# Patient Record
Sex: Female | Born: 1945 | Race: White | Hispanic: No | Marital: Married | Smoking: Never smoker
Health system: Southern US, Community
[De-identification: ages and names within clinical notes are randomized; demographics above are authoritative.]

## PROBLEM LIST (undated history)

## (undated) DIAGNOSIS — I809 Phlebitis and thrombophlebitis of unspecified site: Secondary | ICD-10-CM

## (undated) DIAGNOSIS — Z9884 Bariatric surgery status: Secondary | ICD-10-CM

## (undated) DIAGNOSIS — M542 Cervicalgia: Secondary | ICD-10-CM

## (undated) DIAGNOSIS — Z86718 Personal history of other venous thrombosis and embolism: Secondary | ICD-10-CM

## (undated) DIAGNOSIS — I10 Essential (primary) hypertension: Secondary | ICD-10-CM

## (undated) DIAGNOSIS — B20 Human immunodeficiency virus [HIV] disease: Secondary | ICD-10-CM

## (undated) HISTORY — DX: Cervicalgia: M54.2

## (undated) HISTORY — DX: Human immunodeficiency virus (HIV) disease: B20

## (undated) HISTORY — DX: Personal history of other venous thrombosis and embolism: Z86.718

## (undated) HISTORY — DX: Bariatric surgery status: Z98.84

## (undated) HISTORY — DX: Phlebitis and thrombophlebitis of unspecified site: I80.9

## (undated) HISTORY — DX: Essential (primary) hypertension: I10

---
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
## 1975-02-18 HISTORY — PX: TUBAL LIGATION: SHX77

## 1994-02-17 HISTORY — PX: CHOLECYSTECTOMY: SHX55

## 2000-06-17 HISTORY — PX: GASTRIC BYPASS: SHX52

## 2001-02-17 DIAGNOSIS — B20 Human immunodeficiency virus [HIV] disease: Secondary | ICD-10-CM

## 2001-02-17 DIAGNOSIS — Z21 Asymptomatic human immunodeficiency virus [HIV] infection status: Secondary | ICD-10-CM

## 2001-02-17 HISTORY — DX: Human immunodeficiency virus (HIV) disease: B20

## 2001-02-17 HISTORY — DX: Asymptomatic human immunodeficiency virus (hiv) infection status: Z21

## 2002-02-17 HISTORY — PX: HERNIA REPAIR: SHX51

## 2005-01-30 ENCOUNTER — Encounter (INDEPENDENT_AMBULATORY_CARE_PROVIDER_SITE_OTHER): Payer: Self-pay | Admitting: *Deleted

## 2005-01-30 ENCOUNTER — Ambulatory Visit (HOSPITAL_COMMUNITY): Admission: RE | Admit: 2005-01-30 | Discharge: 2005-01-30 | Payer: Self-pay | Admitting: Infectious Diseases

## 2005-01-30 ENCOUNTER — Ambulatory Visit: Payer: Self-pay | Admitting: Infectious Diseases

## 2005-01-30 LAB — CONVERTED CEMR LAB: CD4 T Cell Abs: 610

## 2005-02-14 ENCOUNTER — Ambulatory Visit: Payer: Self-pay | Admitting: Infectious Diseases

## 2005-03-10 ENCOUNTER — Ambulatory Visit (HOSPITAL_COMMUNITY): Admission: RE | Admit: 2005-03-10 | Discharge: 2005-03-10 | Payer: Self-pay | Admitting: Infectious Diseases

## 2005-05-19 ENCOUNTER — Encounter: Admission: RE | Admit: 2005-05-19 | Discharge: 2005-05-19 | Payer: Self-pay | Admitting: Infectious Diseases

## 2005-05-19 ENCOUNTER — Ambulatory Visit: Payer: Self-pay | Admitting: Infectious Diseases

## 2005-05-19 ENCOUNTER — Encounter (INDEPENDENT_AMBULATORY_CARE_PROVIDER_SITE_OTHER): Payer: Self-pay | Admitting: *Deleted

## 2005-10-07 ENCOUNTER — Encounter (INDEPENDENT_AMBULATORY_CARE_PROVIDER_SITE_OTHER): Payer: Self-pay | Admitting: *Deleted

## 2005-10-07 ENCOUNTER — Ambulatory Visit: Payer: Self-pay | Admitting: Infectious Diseases

## 2005-10-07 ENCOUNTER — Encounter: Admission: RE | Admit: 2005-10-07 | Discharge: 2005-10-07 | Payer: Self-pay | Admitting: Infectious Diseases

## 2005-12-29 ENCOUNTER — Other Ambulatory Visit: Admission: RE | Admit: 2005-12-29 | Discharge: 2005-12-29 | Payer: Self-pay | Admitting: Family Medicine

## 2005-12-29 DIAGNOSIS — N879 Dysplasia of cervix uteri, unspecified: Secondary | ICD-10-CM | POA: Insufficient documentation

## 2006-01-05 ENCOUNTER — Ambulatory Visit: Payer: Self-pay | Admitting: Infectious Diseases

## 2006-03-23 ENCOUNTER — Ambulatory Visit: Payer: Self-pay | Admitting: Infectious Diseases

## 2006-03-23 ENCOUNTER — Encounter (INDEPENDENT_AMBULATORY_CARE_PROVIDER_SITE_OTHER): Payer: Self-pay | Admitting: *Deleted

## 2006-03-23 ENCOUNTER — Encounter: Admission: RE | Admit: 2006-03-23 | Discharge: 2006-03-23 | Payer: Self-pay | Admitting: Infectious Diseases

## 2006-03-23 DIAGNOSIS — I10 Essential (primary) hypertension: Secondary | ICD-10-CM | POA: Insufficient documentation

## 2006-03-23 DIAGNOSIS — B20 Human immunodeficiency virus [HIV] disease: Secondary | ICD-10-CM

## 2006-03-23 DIAGNOSIS — Z9119 Patient's noncompliance with other medical treatment and regimen: Secondary | ICD-10-CM

## 2006-03-23 DIAGNOSIS — Z9889 Other specified postprocedural states: Secondary | ICD-10-CM

## 2006-03-23 DIAGNOSIS — Z91199 Patient's noncompliance with other medical treatment and regimen due to unspecified reason: Secondary | ICD-10-CM | POA: Insufficient documentation

## 2006-03-23 LAB — CONVERTED CEMR LAB
ALT: 16 units/L (ref 0–35)
Albumin: 4 g/dL (ref 3.5–5.2)
BUN: 17 mg/dL (ref 6–23)
CD4 Count: 290 microliters
CO2: 26 meq/L (ref 19–32)
Cholesterol: 152 mg/dL (ref 0–200)
Creatinine, Ser: 0.69 mg/dL (ref 0.40–1.20)
HCT: 42.4 % (ref 36.0–46.0)
HIV 1 RNA Quant: 123000 copies/mL — ABNORMAL HIGH (ref <50)
HIV-1 RNA Quant, Log: 5.09 — ABNORMAL HIGH (ref <1.70)
Hemoglobin: 14 g/dL (ref 12.0–15.0)
LDL Cholesterol: 82 mg/dL (ref 0–99)
MCHC: 33 g/dL (ref 30.0–36.0)
Potassium: 4.2 meq/L (ref 3.5–5.3)
RDW: 13.5 % (ref 11.5–14.0)
Total Bilirubin: 0.3 mg/dL (ref 0.3–1.2)
Total CHOL/HDL Ratio: 4.5
Total Protein: 7.5 g/dL (ref 6.0–8.3)
VLDL: 36 mg/dL (ref 0–40)

## 2006-04-13 ENCOUNTER — Encounter (INDEPENDENT_AMBULATORY_CARE_PROVIDER_SITE_OTHER): Payer: Self-pay | Admitting: *Deleted

## 2006-04-13 LAB — CONVERTED CEMR LAB: Pap Smear: ABNORMAL

## 2006-04-26 ENCOUNTER — Encounter (INDEPENDENT_AMBULATORY_CARE_PROVIDER_SITE_OTHER): Payer: Self-pay | Admitting: *Deleted

## 2006-06-01 ENCOUNTER — Encounter: Admission: RE | Admit: 2006-06-01 | Discharge: 2006-06-01 | Payer: Self-pay | Admitting: Infectious Diseases

## 2006-06-01 ENCOUNTER — Ambulatory Visit: Payer: Self-pay | Admitting: Infectious Diseases

## 2006-06-01 ENCOUNTER — Encounter (INDEPENDENT_AMBULATORY_CARE_PROVIDER_SITE_OTHER): Payer: Self-pay | Admitting: *Deleted

## 2006-06-01 DIAGNOSIS — R1031 Right lower quadrant pain: Secondary | ICD-10-CM

## 2006-06-01 LAB — CONVERTED CEMR LAB
Basophils Absolute: 0 10*3/uL (ref 0.0–0.1)
Basophils Relative: 0 % (ref 0–1)
CO2: 26 meq/L (ref 19–32)
Calcium: 8.8 mg/dL (ref 8.4–10.5)
Chloride: 108 meq/L (ref 96–112)
Cholesterol: 146 mg/dL (ref 0–200)
Creatinine, Ser: 0.6 mg/dL (ref 0.40–1.20)
Eosinophils Absolute: 0.1 10*3/uL (ref 0.0–0.7)
HDL: 34 mg/dL — ABNORMAL LOW (ref >39)
HIV 1 RNA Quant: 53900 copies/mL — ABNORMAL HIGH (ref <50)
Lymphs Abs: 1 10*3/uL (ref 0.7–3.3)
MCHC: 32 g/dL (ref 30.0–36.0)
MCV: 87.7 fL (ref 78.0–100.0)
Monocytes Relative: 9 % (ref 3–11)
Neutro Abs: 1.8 10*3/uL (ref 1.7–7.7)
Platelets: 150 10*3/uL (ref 150–400)
Potassium: 4.2 meq/L (ref 3.5–5.3)
RDW: 13.4 % (ref 11.5–14.0)
Sodium: 141 meq/L (ref 135–145)
Total Bilirubin: 0.3 mg/dL (ref 0.3–1.2)
Total CHOL/HDL Ratio: 4.3
VLDL: 29 mg/dL (ref 0–40)
WBC: 3.1 10*3/uL — ABNORMAL LOW (ref 4.0–10.5)

## 2006-06-04 ENCOUNTER — Telehealth: Payer: Self-pay | Admitting: Infectious Diseases

## 2006-06-24 ENCOUNTER — Encounter: Admission: RE | Admit: 2006-06-24 | Discharge: 2006-06-24 | Payer: Self-pay | Admitting: *Deleted

## 2006-06-30 ENCOUNTER — Telehealth: Payer: Self-pay | Admitting: Infectious Diseases

## 2006-09-09 ENCOUNTER — Telehealth: Payer: Self-pay | Admitting: Infectious Diseases

## 2006-12-21 ENCOUNTER — Encounter: Admission: RE | Admit: 2006-12-21 | Discharge: 2006-12-21 | Payer: Self-pay | Admitting: Infectious Diseases

## 2006-12-21 ENCOUNTER — Ambulatory Visit: Payer: Self-pay | Admitting: Infectious Diseases

## 2006-12-21 LAB — CONVERTED CEMR LAB
BUN: 13 mg/dL (ref 6–23)
Chloride: 106 meq/L (ref 96–112)
Creatinine, Ser: 0.61 mg/dL (ref 0.40–1.20)
Glucose, Bld: 87 mg/dL (ref 70–99)
HIV 1 RNA Quant: 166000 copies/mL — ABNORMAL HIGH (ref <50)
HIV-1 RNA Quant, Log: 5.22 — ABNORMAL HIGH (ref <1.70)
Hemoglobin: 13.6 g/dL (ref 12.0–15.0)
MCHC: 32.2 g/dL (ref 30.0–36.0)
Platelets: 132 10*3/uL — ABNORMAL LOW (ref 150–400)
Potassium: 3.9 meq/L (ref 3.5–5.3)
RDW: 13.5 % (ref 11.5–14.0)
Sodium: 141 meq/L (ref 135–145)
Total Bilirubin: 0.3 mg/dL (ref 0.3–1.2)
Total Protein: 7.4 g/dL (ref 6.0–8.3)
WBC: 2.8 10*3/uL — ABNORMAL LOW (ref 4.0–10.5)

## 2006-12-28 ENCOUNTER — Telehealth: Payer: Self-pay | Admitting: Infectious Diseases

## 2007-01-04 ENCOUNTER — Ambulatory Visit (HOSPITAL_COMMUNITY): Admission: RE | Admit: 2007-01-04 | Discharge: 2007-01-04 | Payer: Self-pay | Admitting: Infectious Diseases

## 2007-02-16 ENCOUNTER — Encounter (INDEPENDENT_AMBULATORY_CARE_PROVIDER_SITE_OTHER): Payer: Self-pay | Admitting: *Deleted

## 2007-02-16 ENCOUNTER — Encounter: Payer: Self-pay | Admitting: Infectious Diseases

## 2007-03-12 ENCOUNTER — Encounter: Admission: RE | Admit: 2007-03-12 | Discharge: 2007-03-12 | Payer: Self-pay | Admitting: Infectious Diseases

## 2007-03-12 ENCOUNTER — Ambulatory Visit: Payer: Self-pay | Admitting: Infectious Diseases

## 2007-03-12 LAB — CONVERTED CEMR LAB
Albumin: 3.8 g/dL (ref 3.5–5.2)
Alkaline Phosphatase: 120 units/L — ABNORMAL HIGH (ref 39–117)
CO2: 23 meq/L (ref 19–32)
Creatinine, Ser: 0.62 mg/dL (ref 0.40–1.20)
Eosinophils Relative: 2 % (ref 0–5)
Glucose, Bld: 82 mg/dL (ref 70–99)
HCT: 41.9 % (ref 36.0–46.0)
HDL: 34 mg/dL — ABNORMAL LOW (ref >39)
HIV 1 RNA Quant: 144000 copies/mL — ABNORMAL HIGH (ref <50)
Hemoglobin: 13.7 g/dL (ref 12.0–15.0)
LDL Cholesterol: 72 mg/dL (ref 0–99)
Lymphocytes Relative: 41 % (ref 12–46)
Lymphs Abs: 1 10*3/uL (ref 0.7–4.0)
Monocytes Relative: 10 % (ref 3–12)
Neutrophils Relative %: 47 % (ref 43–77)
Platelets: 114 10*3/uL — ABNORMAL LOW (ref 150–400)
Sodium: 142 meq/L (ref 135–145)
Total Bilirubin: 0.3 mg/dL (ref 0.3–1.2)
Triglycerides: 130 mg/dL (ref <150)
VLDL: 26 mg/dL (ref 0–40)

## 2007-03-24 ENCOUNTER — Ambulatory Visit: Payer: Self-pay | Admitting: Infectious Diseases

## 2007-03-25 ENCOUNTER — Telehealth: Payer: Self-pay | Admitting: Infectious Diseases

## 2007-04-15 ENCOUNTER — Ambulatory Visit (HOSPITAL_COMMUNITY): Admission: RE | Admit: 2007-04-15 | Discharge: 2007-04-15 | Payer: Self-pay | Admitting: Infectious Diseases

## 2007-05-14 ENCOUNTER — Emergency Department (HOSPITAL_COMMUNITY): Admission: EM | Admit: 2007-05-14 | Discharge: 2007-05-14 | Payer: Self-pay | Admitting: Emergency Medicine

## 2007-06-10 ENCOUNTER — Encounter: Admission: RE | Admit: 2007-06-10 | Discharge: 2007-06-10 | Payer: Self-pay | Admitting: Infectious Diseases

## 2007-06-10 ENCOUNTER — Ambulatory Visit: Payer: Self-pay | Admitting: Infectious Diseases

## 2007-06-10 LAB — CONVERTED CEMR LAB
AST: 17 units/L (ref 0–37)
Albumin: 4.1 g/dL (ref 3.5–5.2)
Alkaline Phosphatase: 109 units/L (ref 39–117)
BUN: 19 mg/dL (ref 6–23)
Calcium: 8.8 mg/dL (ref 8.4–10.5)
Creatinine, Ser: 0.73 mg/dL (ref 0.40–1.20)
Eosinophils Relative: 3 % (ref 0–5)
HIV-1 RNA Quant, Log: <1.7 (ref <1.70)
Lymphocytes Relative: 43 % (ref 12–46)
Lymphs Abs: 1.4 10*3/uL (ref 0.7–4.0)
Monocytes Absolute: 0.2 10*3/uL (ref 0.1–1.0)
Monocytes Relative: 7 % (ref 3–12)
Platelets: 152 10*3/uL (ref 150–400)
RBC: 4.46 M/uL (ref 3.87–5.11)
Total Bilirubin: 0.4 mg/dL (ref 0.3–1.2)

## 2007-06-24 ENCOUNTER — Ambulatory Visit: Payer: Self-pay | Admitting: Infectious Diseases

## 2007-06-24 ENCOUNTER — Encounter (INDEPENDENT_AMBULATORY_CARE_PROVIDER_SITE_OTHER): Payer: Self-pay | Admitting: *Deleted

## 2007-10-29 ENCOUNTER — Other Ambulatory Visit: Admission: RE | Admit: 2007-10-29 | Discharge: 2007-10-29 | Payer: Self-pay | Admitting: Obstetrics and Gynecology

## 2007-11-26 ENCOUNTER — Encounter: Admission: RE | Admit: 2007-11-26 | Discharge: 2007-11-26 | Payer: Self-pay | Admitting: Obstetrics and Gynecology

## 2007-12-23 ENCOUNTER — Ambulatory Visit: Payer: Self-pay | Admitting: Infectious Diseases

## 2007-12-23 LAB — CONVERTED CEMR LAB
ALT: 9 units/L (ref 0–35)
Albumin: 4 g/dL (ref 3.5–5.2)
CO2: 25 meq/L (ref 19–32)
Creatinine, Ser: 0.75 mg/dL (ref 0.40–1.20)
HCT: 40 % (ref 36.0–46.0)
Hemoglobin: 12.7 g/dL (ref 12.0–15.0)
MCV: 86.2 fL (ref 78.0–100.0)
Platelets: 177 10*3/uL (ref 150–400)
RDW: 13.8 % (ref 11.5–15.5)
Total Bilirubin: 0.3 mg/dL (ref 0.3–1.2)

## 2007-12-24 ENCOUNTER — Encounter: Payer: Self-pay | Admitting: Infectious Diseases

## 2008-01-06 ENCOUNTER — Ambulatory Visit: Payer: Self-pay | Admitting: Infectious Diseases

## 2008-01-06 LAB — HM PAP SMEAR

## 2008-06-29 ENCOUNTER — Ambulatory Visit: Payer: Self-pay | Admitting: Infectious Diseases

## 2008-06-29 LAB — CONVERTED CEMR LAB
ALT: 10 units/L (ref 0–35)
BUN: 17 mg/dL (ref 6–23)
Basophils Absolute: 0 10*3/uL (ref 0.0–0.1)
Basophils Relative: 0 % (ref 0–1)
Chloride: 109 meq/L (ref 96–112)
Creatinine, Ser: 0.64 mg/dL (ref 0.40–1.20)
Eosinophils Absolute: 0.1 10*3/uL (ref 0.0–0.7)
Eosinophils Relative: 5 % (ref 0–5)
GFR calc Af Amer: >60 mL/min (ref >60)
GFR calc non Af Amer: >60 mL/min (ref >60)
HDL: 28 mg/dL — ABNORMAL LOW (ref >39)
HIV-1 RNA Quant, Log: 5.39 — ABNORMAL HIGH (ref <1.68)
Hemoglobin: 12.3 g/dL (ref 12.0–15.0)
Monocytes Absolute: 0.2 10*3/uL (ref 0.1–1.0)
Neutro Abs: 1.4 10*3/uL — ABNORMAL LOW (ref 1.7–7.7)
Neutrophils Relative %: 54 % (ref 43–77)
Potassium: 3.8 meq/L (ref 3.5–5.3)
RBC: 4.57 M/uL (ref 3.87–5.11)
Total Bilirubin: 0.3 mg/dL (ref 0.3–1.2)
WBC: 2.7 10*3/uL — ABNORMAL LOW (ref 4.0–10.5)

## 2008-07-12 ENCOUNTER — Ambulatory Visit: Payer: Self-pay | Admitting: Infectious Diseases

## 2008-07-12 LAB — CONVERTED CEMR LAB: HIV-1 RNA Quant, Log: 5.72 — ABNORMAL HIGH (ref <1.68)

## 2008-08-01 ENCOUNTER — Encounter (INDEPENDENT_AMBULATORY_CARE_PROVIDER_SITE_OTHER): Payer: Self-pay | Admitting: *Deleted

## 2008-12-28 ENCOUNTER — Ambulatory Visit: Payer: Self-pay | Admitting: Infectious Diseases

## 2008-12-28 LAB — CONVERTED CEMR LAB
AST: 16 units/L (ref 0–37)
Alkaline Phosphatase: 122 units/L — ABNORMAL HIGH (ref 39–117)
BUN: 19 mg/dL (ref 6–23)
Chloride: 104 meq/L (ref 96–112)
Creatinine, Ser: 0.73 mg/dL (ref 0.40–1.20)
Eosinophils Absolute: 0.1 10*3/uL (ref 0.0–0.7)
Eosinophils Relative: 3 % (ref 0–5)
Glucose, Bld: 83 mg/dL (ref 70–99)
HIV 1 RNA Quant: 69 copies/mL — ABNORMAL HIGH (ref <48)
Lymphs Abs: 1.5 10*3/uL (ref 0.7–4.0)
MCHC: 31.7 g/dL (ref 30.0–36.0)
Monocytes Relative: 6 % (ref 3–12)
Neutro Abs: 1.9 10*3/uL (ref 1.7–7.7)
Neutrophils Relative %: 51 % (ref 43–77)
RBC: 4.78 M/uL (ref 3.87–5.11)
Sodium: 139 meq/L (ref 135–145)

## 2009-01-15 ENCOUNTER — Ambulatory Visit: Payer: Self-pay | Admitting: Infectious Diseases

## 2009-02-03 IMAGING — CT CT ABDOMEN W/ CM
2 of 5 series · 16 of 46 positions shown, 18 images · IV contrast (APPLIED)
Comparison: Abdominal sonogram dated 01/04/07.

CLINICAL DATA: Intermittent lower abdominal pain.  History of gastric bypass, tummy tuck, and cholecystectomy.  
 ABDOMEN CT WITH CONTRAST:
TECHNIQUE: Multidetector CT imaging of the abdomen was performed following the standard protocol during bolus administration of intravenous contrast.
 Contrast:  100 cc Omnipaque 300.
TECHNIQUE: Multidetector CT imaging of the pelvis was performed following the standard protocol during bolus administration of intravenous contrast.

[Series 4: abd/pelv with 5.0 b31f st · axial · 0.71mm/px · z∈[-454,-54]mm · 13 of 90 slices shown, 15 images]
[im 5/90  soft-tissue]
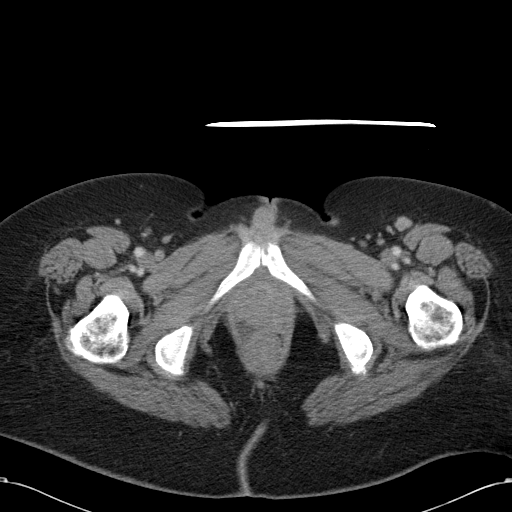
[im 5/90  bone]
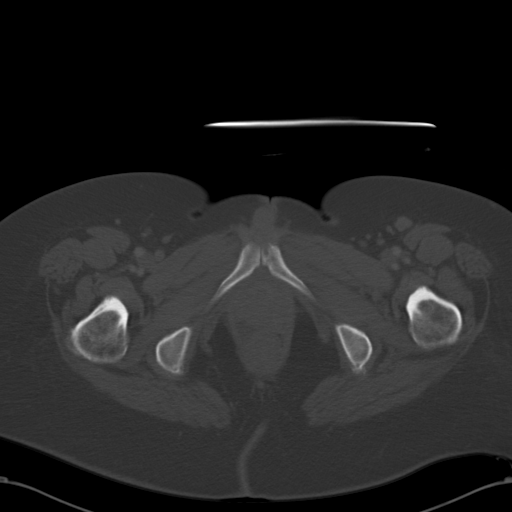
[im 10/90  soft-tissue]
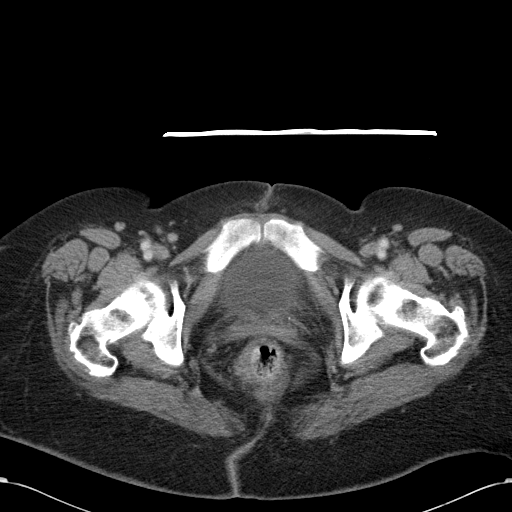
[im 20/90  soft-tissue]
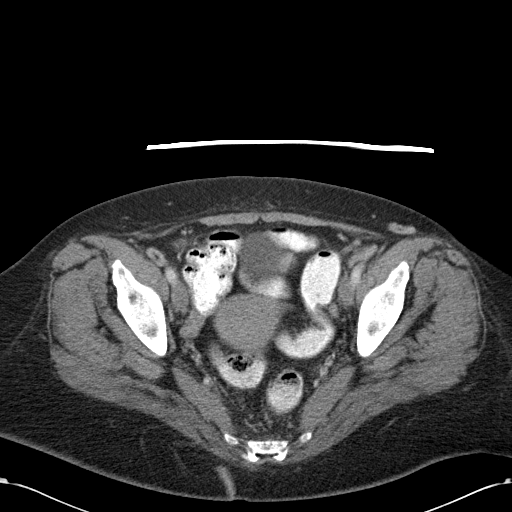
[im 25/90  soft-tissue]
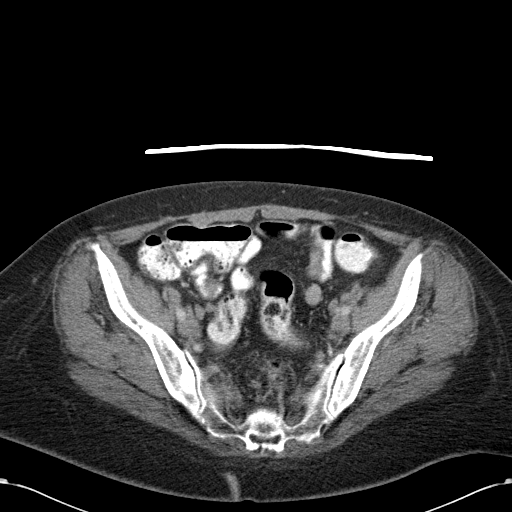
[im 30/90  soft-tissue]
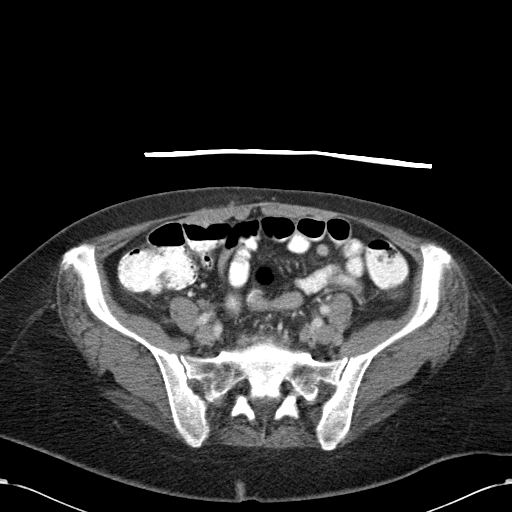
[im 40/90  soft-tissue]
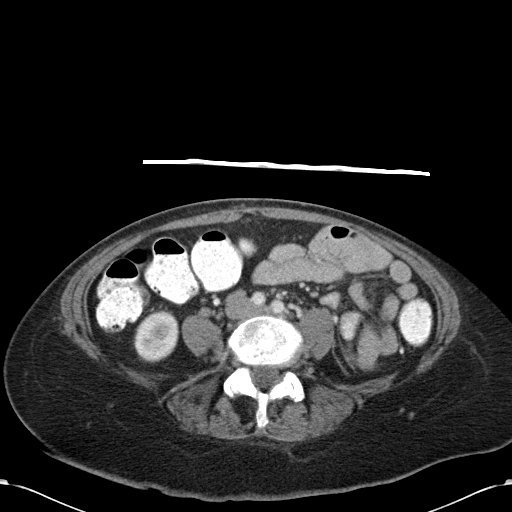
[im 45/90  soft-tissue]
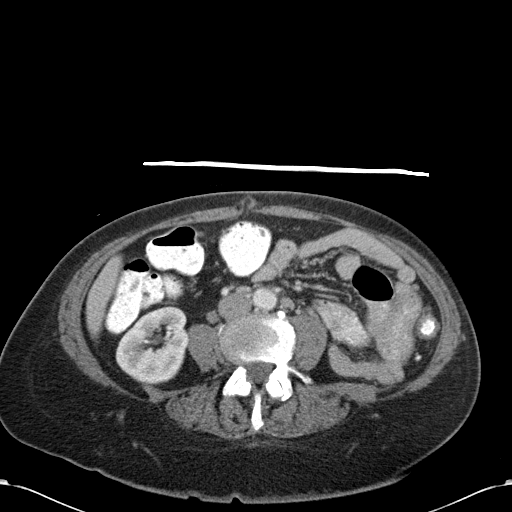
[im 50/90  soft-tissue]
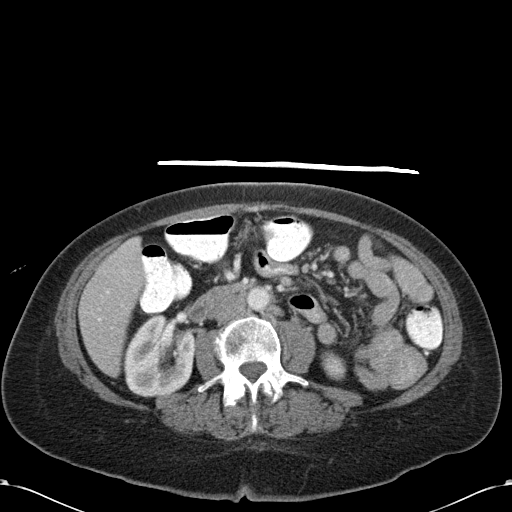
[im 60/90  soft-tissue]
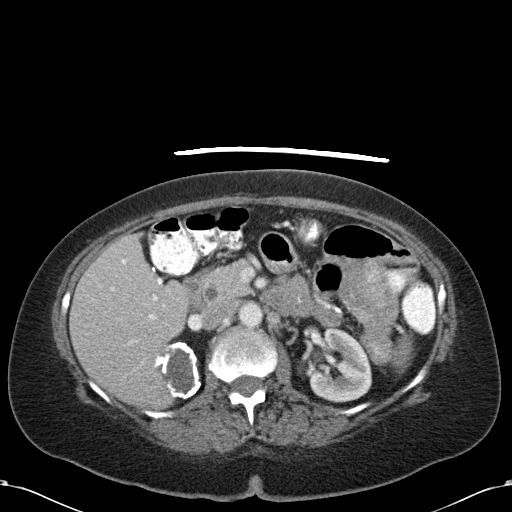
[im 60/90  bone]
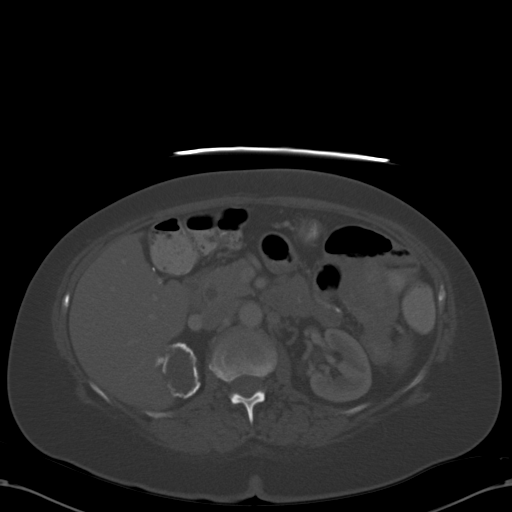
[im 65/90  soft-tissue]
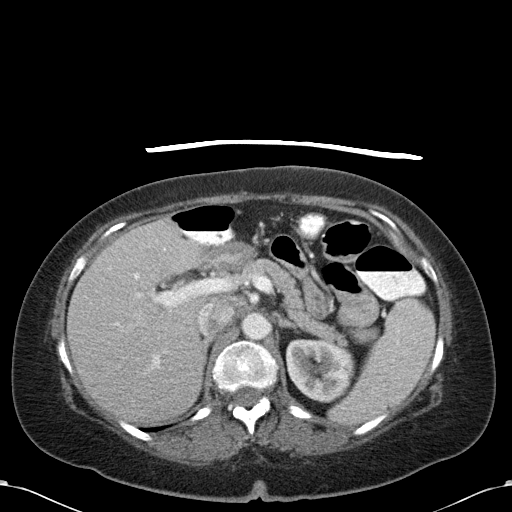
[im 70/90  soft-tissue]
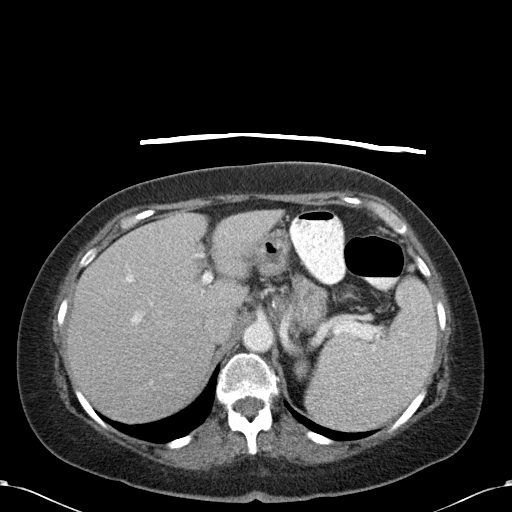
[im 80/90  soft-tissue]
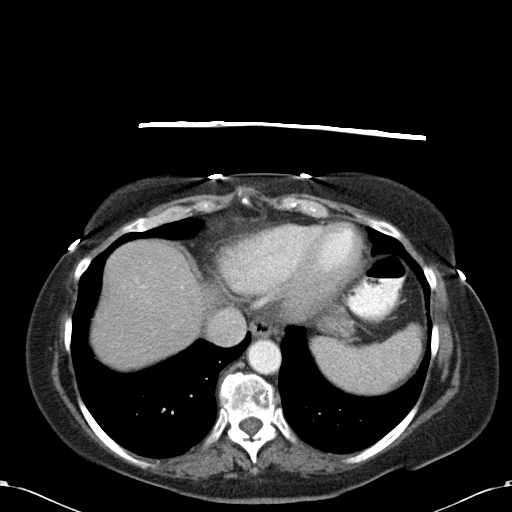
[im 85/90  soft-tissue]
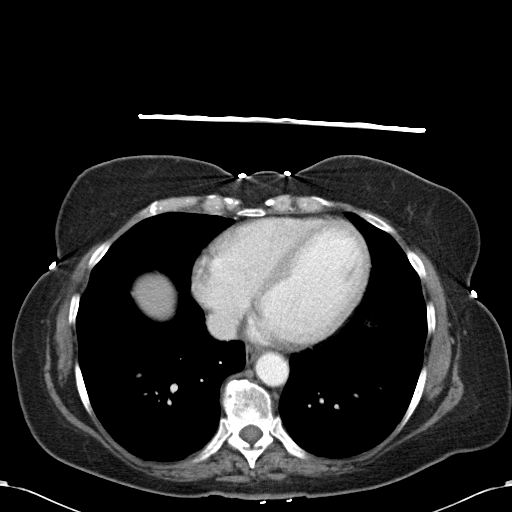

[Series 602: cor abd/pel · coronal · 0.88mm/px · 3 of 107 slices shown]
[im 36/107  soft-tissue]
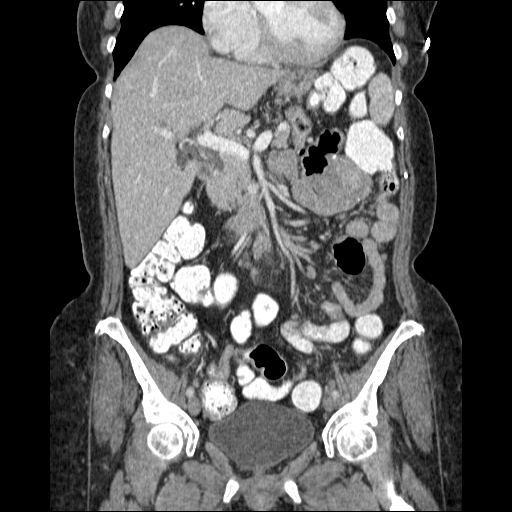
[im 48/107  soft-tissue]
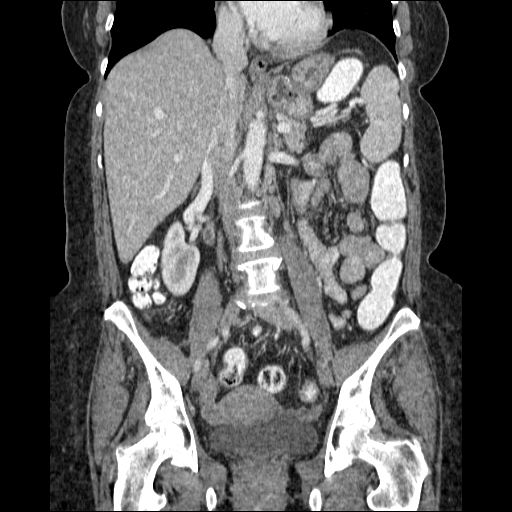
[im 59/107  soft-tissue]
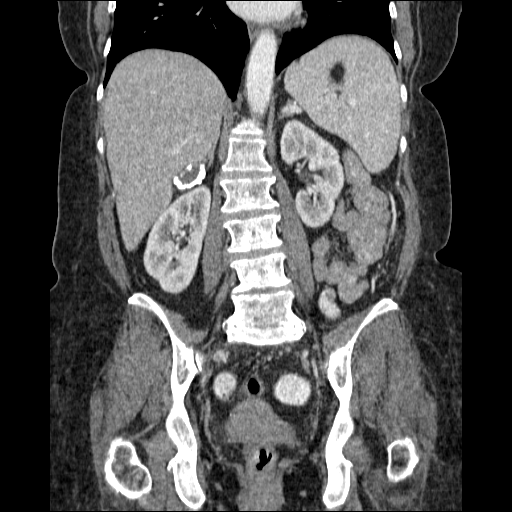

[16 of 46 positions shown; findings below may reference images not displayed]

FINDINGS: There is a 4.5 mm nodule at the right lung base, image number 6.  The left lung base is clear.  The patient is status post cholecystectomy.  The common bile duct is increased in caliber measuring 8.7 mm, image 31.  There is no evidence for intrahepatic biliary ductal dilatation.  The spleen is negative.  The left adrenal gland is negative.  Between the right adrenal gland and the superior pole of the right kidney there is a hypodense mass with thick peripheral calcification measuring 4 cm, image 32.  The right kidney and the left kidney are both normal.  There are no enlarged retroperitoneal or small bowel mesenteric lymph nodes.  Postoperative changes in the region of the gastric fundus consistent with the stated history of prior gastric bypass surgery.  The bowel loops of the upper abdomen are normal in their course and caliber.  There is no evidence for bowel obstruction.  No free fluid or abnormal fluid collections are identified.  There is a mild dextro scoliosis affecting the lumbar spine.  Multilevel degenerative disc disease is noted worse at the L5-S1 level.
IMPRESSION: 1.  No acute findings.  
 2.  Status post gastric bypass surgery and cholecystectomy.  The common bile duct is increased in caliber measuring 8.7 mm without evidence for intrahepatic biliary ductal dilatation.  
 3.  Hypodense mass adjacent to the right adrenal gland with dense peripheral calcification likely represents a posttraumatic cyst.  This is easily seen on the scout image and follow-up examination in one year could be performed with a plain radiograph of the abdomen.
 PELVIS CT WITH CONTRAST:
FINDINGS: The uterus and the adnexal structures are unremarkable.  The urinary bladder is negative.  There is no free fluid or abnormal fluid collections.  No pelvic mass is noted.  Review of bone windows is unremarkable.
IMPRESSION: 1.  No acute pelvic CT findings. 
 2.  Negative for mass or adenopathy.

## 2009-02-17 DIAGNOSIS — Z86718 Personal history of other venous thrombosis and embolism: Secondary | ICD-10-CM

## 2009-02-17 HISTORY — DX: Personal history of other venous thrombosis and embolism: Z86.718

## 2009-04-05 ENCOUNTER — Encounter: Payer: Self-pay | Admitting: Infectious Diseases

## 2009-05-17 ENCOUNTER — Ambulatory Visit: Payer: Self-pay | Admitting: Infectious Diseases

## 2009-05-17 LAB — CONVERTED CEMR LAB
ALT: 11 units/L (ref 0–35)
Albumin: 3.7 g/dL (ref 3.5–5.2)
Alkaline Phosphatase: 129 units/L — ABNORMAL HIGH (ref 39–117)
Calcium: 8.7 mg/dL (ref 8.4–10.5)
Eosinophils Absolute: 0.1 10*3/uL (ref 0.0–0.7)
Glucose, Bld: 83 mg/dL (ref 70–99)
HCT: 37.2 % (ref 36.0–46.0)
HIV 1 RNA Quant: 79800 copies/mL — ABNORMAL HIGH (ref <48)
Hemoglobin: 11.8 g/dL — ABNORMAL LOW (ref 12.0–15.0)
Lymphocytes Relative: 44 % (ref 12–46)
Lymphs Abs: 1 10*3/uL (ref 0.7–4.0)
Neutrophils Relative %: 50 % (ref 43–77)
Potassium: 4.1 meq/L (ref 3.5–5.3)
RBC: 4.63 M/uL (ref 3.87–5.11)
Sodium: 138 meq/L (ref 135–145)
Total Bilirubin: 0.3 mg/dL (ref 0.3–1.2)
Total CHOL/HDL Ratio: 4.7
Triglycerides: 136 mg/dL (ref <150)

## 2009-06-13 ENCOUNTER — Ambulatory Visit: Payer: Self-pay | Admitting: Infectious Diseases

## 2009-07-24 ENCOUNTER — Ambulatory Visit: Payer: Self-pay | Admitting: Internal Medicine

## 2009-07-24 DIAGNOSIS — F411 Generalized anxiety disorder: Secondary | ICD-10-CM | POA: Insufficient documentation

## 2009-07-26 ENCOUNTER — Encounter: Payer: Self-pay | Admitting: Infectious Diseases

## 2009-08-01 ENCOUNTER — Encounter: Payer: Self-pay | Admitting: Infectious Diseases

## 2009-08-02 ENCOUNTER — Telehealth (INDEPENDENT_AMBULATORY_CARE_PROVIDER_SITE_OTHER): Payer: Self-pay | Admitting: *Deleted

## 2009-08-02 ENCOUNTER — Encounter: Payer: Self-pay | Admitting: Internal Medicine

## 2009-08-07 ENCOUNTER — Ambulatory Visit: Payer: Self-pay | Admitting: Infectious Diseases

## 2009-08-07 LAB — CONVERTED CEMR LAB
Alkaline Phosphatase: 109 units/L (ref 39–117)
CO2: 27 meq/L (ref 19–32)
Eosinophils Relative: 4 % (ref 0–5)
Glucose, Bld: 90 mg/dL (ref 70–99)
HCT: 37.1 % (ref 36.0–46.0)
HIV 1 RNA Quant: 217000 copies/mL — ABNORMAL HIGH (ref <48)
HIV-1 RNA Quant, Log: 5.34 — ABNORMAL HIGH (ref <1.68)
Hemoglobin: 11.8 g/dL — ABNORMAL LOW (ref 12.0–15.0)
Lymphocytes Relative: 33 % (ref 12–46)
Lymphs Abs: 1 10*3/uL (ref 0.7–4.0)
MCHC: 31.8 g/dL (ref 30.0–36.0)
Neutro Abs: 1.6 10*3/uL — ABNORMAL LOW (ref 1.7–7.7)
Neutrophils Relative %: 53 % (ref 43–77)
Platelets: 177 10*3/uL (ref 150–400)
Potassium: 4.3 meq/L (ref 3.5–5.3)
RDW: 15.9 % — ABNORMAL HIGH (ref 11.5–15.5)
Total Bilirubin: 0.3 mg/dL (ref 0.3–1.2)
Total Protein: 7.3 g/dL (ref 6.0–8.3)
WBC: 3.1 10*3/uL — ABNORMAL LOW (ref 4.0–10.5)

## 2009-08-23 ENCOUNTER — Ambulatory Visit: Payer: Self-pay | Admitting: Infectious Diseases

## 2009-08-29 ENCOUNTER — Encounter (INDEPENDENT_AMBULATORY_CARE_PROVIDER_SITE_OTHER): Payer: Self-pay | Admitting: *Deleted

## 2009-09-12 ENCOUNTER — Encounter: Payer: Self-pay | Admitting: Infectious Diseases

## 2009-09-27 ENCOUNTER — Telehealth: Payer: Self-pay | Admitting: Infectious Diseases

## 2009-10-11 ENCOUNTER — Telehealth: Payer: Self-pay | Admitting: Infectious Diseases

## 2009-10-26 ENCOUNTER — Encounter: Payer: Self-pay | Admitting: Infectious Diseases

## 2009-10-30 ENCOUNTER — Telehealth: Payer: Self-pay | Admitting: Infectious Diseases

## 2009-11-12 ENCOUNTER — Telehealth (INDEPENDENT_AMBULATORY_CARE_PROVIDER_SITE_OTHER): Payer: Self-pay | Admitting: Licensed Clinical Social Worker

## 2010-01-07 ENCOUNTER — Encounter (INDEPENDENT_AMBULATORY_CARE_PROVIDER_SITE_OTHER): Payer: Self-pay | Admitting: *Deleted

## 2010-01-16 ENCOUNTER — Ambulatory Visit: Payer: Self-pay | Admitting: Oncology

## 2010-01-30 LAB — MORPHOLOGY: PLT EST: ADEQUATE

## 2010-01-30 LAB — CBC & DIFF AND RETIC
BASO%: 0.3 % (ref 0.0–2.0)
EOS%: 2.1 % (ref 0.0–7.0)
HCT: 36.6 % (ref 34.8–46.6)
Immature Retic Fract: 11.6 % — ABNORMAL HIGH (ref 0.00–10.70)
MCH: 26.7 pg (ref 25.1–34.0)
MCHC: 32.2 g/dL (ref 31.5–36.0)
MONO#: 0.3 10*3/uL (ref 0.1–0.9)
NEUT%: 54.4 % (ref 38.4–76.8)
RBC: 4.42 10*6/uL (ref 3.70–5.45)
RDW: 17.2 % — ABNORMAL HIGH (ref 11.2–14.5)
Retic %: 1.13 % (ref 0.50–1.50)
Retic Ct Abs: 49.95 10*3/uL (ref 18.30–72.70)
WBC: 3.4 10*3/uL — ABNORMAL LOW (ref 3.9–10.3)
lymph#: 1.1 10*3/uL (ref 0.9–3.3)
nRBC: 0 % (ref 0–0)

## 2010-01-30 LAB — CHCC SMEAR

## 2010-01-31 ENCOUNTER — Ambulatory Visit: Payer: Self-pay | Admitting: Infectious Diseases

## 2010-01-31 ENCOUNTER — Encounter: Payer: Self-pay | Admitting: Infectious Diseases

## 2010-01-31 LAB — CONVERTED CEMR LAB
AST: 14 units/L (ref 0–37)
Albumin: 3.3 g/dL — ABNORMAL LOW (ref 3.5–5.2)
Alkaline Phosphatase: 80 units/L (ref 39–117)
Basophils Relative: 0 % (ref 0–1)
CO2: 26 meq/L (ref 19–32)
Calcium: 8.2 mg/dL — ABNORMAL LOW (ref 8.4–10.5)
Glucose, Bld: 101 mg/dL — ABNORMAL HIGH (ref 70–99)
HCT: 35.7 % — ABNORMAL LOW (ref 36.0–46.0)
Lymphocytes Relative: 34 % (ref 12–46)
Lymphs Abs: 1 10*3/uL (ref 0.7–4.0)
MCV: 85 fL (ref 78.0–100.0)
Monocytes Absolute: 0.3 10*3/uL (ref 0.1–1.0)
Monocytes Relative: 11 % (ref 3–12)
Neutrophils Relative %: 52 % (ref 43–77)
Platelets: 183 10*3/uL (ref 150–400)
Potassium: 3.6 meq/L (ref 3.5–5.3)
Total Bilirubin: 0.2 mg/dL — ABNORMAL LOW (ref 0.3–1.2)
Total Protein: 6.5 g/dL (ref 6.0–8.3)
WBC: 3 10*3/uL — ABNORMAL LOW (ref 4.0–10.5)

## 2010-02-01 LAB — LUPUS ANTICOAGULANT PANEL
DRVVT 1:1 Mix: 41.1 secs (ref 36.2–44.3)
DRVVT: 50.3 secs — ABNORMAL HIGH (ref 36.2–44.3)
Lupus Anticoagulant: NOT DETECTED

## 2010-02-01 LAB — COMPREHENSIVE METABOLIC PANEL
ALT: 14 U/L (ref 0–35)
AST: 19 U/L (ref 0–37)
Alkaline Phosphatase: 85 U/L (ref 39–117)
BUN: 15 mg/dL (ref 6–23)
Calcium: 8.4 mg/dL (ref 8.4–10.5)
Creatinine, Ser: 0.62 mg/dL (ref 0.40–1.20)
Total Bilirubin: 0.3 mg/dL (ref 0.3–1.2)

## 2010-02-01 LAB — CARDIOLIPIN ANTIBODIES, IGG, IGM, IGA
Anticardiolipin IgA: 5 APL U/mL (ref <22)
Anticardiolipin IgM: 1 MPL U/mL (ref <11)

## 2010-02-01 LAB — PROTHROMBIN GENE MUTATION

## 2010-02-01 LAB — FACTOR 5 LEIDEN

## 2010-02-01 LAB — ANTITHROMBIN III: AntiThromb III Func: 124 % (ref 76–126)

## 2010-02-01 LAB — D-DIMER, QUANTITATIVE: D-Dimer, Quant: 0.25 ug/mL-FEU (ref 0.00–0.48)

## 2010-02-01 LAB — BETA-2 GLYCOPROTEIN ANTIBODIES: Beta-2-Glycoprotein I IgA: 5 A Units (ref <20)

## 2010-02-21 ENCOUNTER — Ambulatory Visit
Admission: RE | Admit: 2010-02-21 | Discharge: 2010-02-21 | Payer: Self-pay | Source: Home / Self Care | Attending: Infectious Diseases | Admitting: Infectious Diseases

## 2010-02-21 DIAGNOSIS — I82409 Acute embolism and thrombosis of unspecified deep veins of unspecified lower extremity: Secondary | ICD-10-CM | POA: Insufficient documentation

## 2010-02-22 ENCOUNTER — Telehealth: Payer: Self-pay | Admitting: Infectious Diseases

## 2010-02-26 ENCOUNTER — Encounter: Payer: Self-pay | Admitting: Infectious Diseases

## 2010-03-04 ENCOUNTER — Ambulatory Visit: Payer: Self-pay | Admitting: Oncology

## 2010-03-06 ENCOUNTER — Encounter: Payer: Self-pay | Admitting: Infectious Diseases

## 2010-03-10 ENCOUNTER — Encounter: Payer: Self-pay | Admitting: Infectious Diseases

## 2010-03-19 NOTE — Assessment & Plan Note (Signed)
Summary: f/u labs-TY   CC:  follow-up visit.  History of Present Illness: 65 yo F with HIV+. 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  Last CD4 380---> 200  VL 69---> 79,800 (12-28-08---> 05-17-09).  Her husband has had multiple spinal surgeries, leak of CSF. taking meds irregularly due to this. asks about new once daily pill (quad). wt up 10#.   Preventive Screening-Counseling & Management  Alcohol-Tobacco     Alcohol drinks/day: occassionally     Alcohol type: wine     Smoking Status: never  Caffeine-Diet-Exercise     Caffeine use/day: coffee and sodas     Does Patient Exercise: no  Safety-Violence-Falls     Seat Belt Use: yes  Allergies: 1)  ! Penicillin 2)  ! * Eggs    Updated Prior Medication List: ISENTRESS 400 MG  TABS (RALTEGRAVIR POTASSIUM) Take 1 tablet by mouth two times a day KALETRA 200-50 MG  TABS (LOPINAVIR-RITONAVIR) 2 tab by mouth bid TRUVADA 200-300 MG  TABS (EMTRICITABINE-TENOFOVIR) Take 1 tablet by mouth once a day  Current Allergies (reviewed today): ! PENICILLIN ! * EGGS Past History:  Past medical, surgical, family and social histories (including risk factors) reviewed, and no changes noted (except as noted below).  Past Medical History: Reviewed history from 03/23/2006 and no changes required. HIV disease 2004 Hypertension  Past Surgical History: Reviewed history from 01/15/2009 and no changes required. Hernia repair (adb) 2004 Gastric bypass (06/17/2000)  Family History: Reviewed history from 01/06/2008 and no changes required. husband HIV+ daughter "bipolar, schizophrenic"  Social History: Reviewed history from 01/06/2008 and no changes required. Married Never Smoked Alcohol use-yes, occas wine  Vital Signs:  Patient profile:   65 year old female Height:      64 inches (162.56 cm) Weight:      212.4 pounds (96.55 kg) BMI:     36.59 Temp:     97.7 degrees F (36.50 degrees C) oral Pulse rate:   67 /  minute BP sitting:   168 / 89  (right arm)  Vitals Entered By: Baxter Hire) (June 13, 2009 9:26 AM) CC: follow-up visit Pain Assessment Patient in pain? no      Nutritional Status BMI of > 30 = obese Nutritional Status Detail appetite is good per patient  Have you ever been in a relationship where you felt threatened, hurt or afraid?No   Does patient need assistance? Functional Status Self care Ambulation Normal        Medication Adherence: 06/13/2009   Adherence to medications reviewed with patient. Counseling to provide adequate adherence provided   Prevention For Positives: 06/13/2009   Safe sex practices discussed with patient. Condoms offered.                             Physical Exam  General:  well-developed, well-nourished, well-hydrated, and overweight-appearing.   Eyes:  pupils equal, pupils round, and pupils reactive to light.   Mouth:  pharynx pink and moist and no exudates.   Neck:  no masses.   Lungs:  normal respiratory effort and normal breath sounds.   Heart:  normal rate, regular rhythm, and no murmur.   Abdomen:  soft, non-tender, and normal bowel sounds.     Impression & Recommendations:  Problem # 1:  HIV DISEASE (ICD-042) not sexually active, married with HIV+, incapcitated. wants to loose wt. encouraged her to take meds, await quad pill..  return to clinic 3-4  months with husband.   Problem # 2:  HYPERTENSION (ICD-401.9) very stressed with her husband's illness and her wt gain. she has not been on anti-htn meds before. she is not taking her current meds well, am hesitant to add more. needs stress mgmt.    Other Orders: Est. Patient Level IV (16109) Future Orders: T-CD4SP ([facility]) (CD4SP) ... 09/11/2009 T-HIV Viral Load [phone]) ... 09/11/2009 T-Comprehensive Metabolic Panel [phone]) ... 09/11/2009 T-CBC w/Diff ([id number]) ... 09/11/2009  Process Orders Check Orders Results:     [facility]:  ABN not required for this insurance Tests Sent for requisitioning (June 13, 2009 10:07 AM):     09/11/2009: [facility] -- T-HIV Viral Load [phone] (signed)     09/11/2009: [facility] -- T-Comprehensive Metabolic Panel [[id number]] (signed)     09/11/2009: [facility] -- [facility] w/Diff [[id number]] (signed)

## 2010-03-19 NOTE — Letter (Signed)
Summary: [employer]  Disability: Physician Questionnaire  [employer]  Disability: Physician Questionnaire   Imported By: Florinda Marker 11/01/2009 14:51:26  _____________________________________________________________________  External Attachment:    Type:   Image     Comment:   External Document

## 2010-03-19 NOTE — Assessment & Plan Note (Signed)
Summary: feelings of depression, extreme stress, ha   CC:  pt. c/o severe anxiety and depression x 2 months, husband having health problems, and Depression.  History of Present Illness: Pt's husband has been in and out of the hospital for past 2 months with a spinal leak and infection. She has been trying to work and take care of him and she is feeling very stressed.  She tried to get FMLA but was denied for some reason. She feels like she is no longer able to function.  Depression History:      The patient is having a depressed mood most of the day and has a diminished interest in her usual daily activities.        Suicide risk questions reveal that she does not feel like life is worth living.  The patient denies that she wishes that she were dead and denies that she has thought about ending her life.        Preventive Screening-Counseling & Management  Alcohol-Tobacco     Alcohol drinks/day: occassionally     Alcohol type: wine     Smoking Status: never  Caffeine-Diet-Exercise     Caffeine use/day: coffee and sodas     Does Patient Exercise: no  Safety-Violence-Falls     Seat Belt Use: yes      Sexual History:  currently monogamous.     Updated Prior Medication List: ISENTRESS 400 MG  TABS (RALTEGRAVIR POTASSIUM) Take 1 tablet by mouth two times a day KALETRA 200-50 MG  TABS (LOPINAVIR-RITONAVIR) 2 tab by mouth bid TRUVADA 200-300 MG  TABS (EMTRICITABINE-TENOFOVIR) Take 1 tablet by mouth once a day ATIVAN 1 MG TABS (LORAZEPAM) Take 1 tablet by mouth every 8 hours as needed  Current Allergies (reviewed today): ! PENICILLIN Past History:  Past Medical History: Last updated: 03/23/2006 HIV disease 2004 Hypertension  Social History: Sexual History:  currently monogamous  Review of Systems  The patient denies anorexia, fever, weight loss, and chest pain.    Vital Signs:  Patient profile:   65 year old female Height:      64 inches (162.56 cm) Weight:      212.12  pounds (96.42 kg) BMI:     36.54 Temp:     98.3 degrees F (36.83 degrees C) oral Pulse rate:   59 / minute BP sitting:   173 / 98  (right arm)  Vitals Entered By: Wendall Mola CMA Duncan Dull) (July 24, 2009 3:32 PM) CC: pt. c/o severe anxiety and depression x 2 months, husband having health problems, Depression Is Patient Diabetic? No Pain Assessment Patient in pain? no      Nutritional Status BMI of > 30 = obese Nutritional Status Detail appetite "good"  Does patient need assistance? Functional Status Self care Ambulation Normal Comments pt. has not been taking HAART meds. for two months B/P elevated   Physical Exam  General:  alert.  tearful Head:  normocephalic and atraumatic.   Lungs:  normal breath sounds.     Impression & Recommendations:  Problem # 1:  ANXIETY STATE, UNSPECIFIED (ICD-300.00)  due to medical issues with her husband and financial concerns will refer to MH treat with ativan as needed not to take her out of work - will fill out FMLA form once available Her updated medication list for this problem includes:    Ativan 1 Mg Tabs (Lorazepam) .Marland Kitchen... Take 1 tablet by mouth every 8 hours as needed  Orders: Est. Patient Level IV (62130)  Medications Added to Medication List This Visit: 1)  Ativan 1 Mg Tabs (Lorazepam) .... Take 1 tablet by mouth every 8 hours as needed  Patient Instructions: 1)  schedule an appt with MH  Prescriptions: ATIVAN 1 MG TABS (LORAZEPAM) Take 1 tablet by mouth every 8 hours as needed  #30 x 0   Entered and Authorized by:   Yisroel Ramming MD   Signed by:   Yisroel Ramming MD on 07/24/2009   Method used:   Print then Give to Patient   RxID:   [id number]

## 2010-03-19 NOTE — Letter (Signed)
Summary: [employer] Disability   [employer] Disability   Imported By: Florinda Marker 08/06/2009 15:38:20  _____________________________________________________________________  External Attachment:    Type:   Image     Comment:   External Document

## 2010-03-19 NOTE — Letter (Signed)
Summary: Out of Work  [facility]  [address]   [place], [place] [address]   Phone: [phone]  Fax: [phone]    August 07, 2009   Employee:  Amos L Jutte    To Whom It May Concern:   For Medical reasons, please excuse the above named employee from work for the following dates:  Start:   August 07, 2009  End:   August 21, 2009  As her medications are adjusted and her symptoms are improved.   If you need additional information, please feel free to contact our office.         Sincerely,    Johny Sax MD

## 2010-03-19 NOTE — Assessment & Plan Note (Signed)
Summary: BP CK/VS   Vital Signs:  Patient profile:   65 year old female BP sitting:   140 / 73  (right arm)  Vitals Entered By: Baxter Hire) (August 23, 2009 10:17 AM) Prior Medications: ISENTRESS 400 MG  TABS (RALTEGRAVIR POTASSIUM) Take 1 tablet by mouth two times a day KALETRA 200-50 MG  TABS (LOPINAVIR-RITONAVIR) 2 tab by mouth bid TRUVADA 200-300 MG  TABS (EMTRICITABINE-TENOFOVIR) Take 1 tablet by mouth once a day ATIVAN 1 MG TABS (LORAZEPAM) Take 1 tablet by mouth every 8 hours as needed HYDROCHLOROTHIAZIDE 50 MG TABS (HYDROCHLOROTHIAZIDE) Take one (1) by mouth daily Current Allergies: ! PENICILLIN

## 2010-03-19 NOTE — Letter (Signed)
Summary: [facility]  [facility]   Imported By: Margie Billet 05/15/2009 11:37:41  _____________________________________________________________________  External Attachment:    Type:   Image     Comment:   External Document

## 2010-03-19 NOTE — Progress Notes (Signed)
Summary: Disabiltiy forms  Phone Note Outgoing Call   Caller: Patient Call For: Annice Pih Reason for Call: Insurance Question Details for Reason: Disability Papers Call placed by: Wendall Mola CMA Duncan Dull),  August 02, 2009 12:40 PM Action Taken: Phone Call Completed Details for Reason: Disability Papers Summary of Call: Called pt. to notify her that short term disabiltiy and FMLA papers have been faxed to [facility] at [phone] Initial call taken by: Wendall Mola CMA Duncan Dull),  August 02, 2009 12:41 PM

## 2010-03-19 NOTE — Letter (Signed)
Summary: Out of Work  [facility]  [address]   [place], [place] [address]   Phone: [phone]  Fax: [phone]    July 24, 2009   Employee:  Linday L Clingan    To Whom It May Concern:   For Medical reasons, please excuse the above named employee from work for the following dates:  Start:   07/24/2009  End:   08/21/2009  If you need additional information, please feel free to contact our office.         Sincerely,    Yisroel Ramming MD

## 2010-03-19 NOTE — Letter (Signed)
Summary: [employer]: FMLA  [employer]: FMLA   Imported By: Florinda Marker 08/06/2009 15:32:48  _____________________________________________________________________  External Attachment:    Type:   Image     Comment:   External Document

## 2010-03-19 NOTE — Medication Information (Signed)
Summary: [facility]   Imported By: Florinda Marker 09/13/2009 14:26:35  _____________________________________________________________________  External Attachment:    Type:   Image     Comment:   External Document

## 2010-03-19 NOTE — Progress Notes (Signed)
Summary: patient wanting to try complera  Phone Note Call from Patient   Caller: Patient Call For: Johny Sax MD Summary of Call: Patient states she is "very frustrated" because she can't take the complera and she states she has not taken her current regimen for 6 months because she can't take multiple pills. She feels her health is declining, and wants to at least try the complera. Initial call taken by: Starleen Arms CMA,  November 12, 2009 9:12 AM  Follow-up for Phone Call        Per Dr Ninetta Lights she would not be a good candidate for Complera because of existing resistance. Pt aware but was unhappy with the answer. Follow-up by: Starleen Arms CMA,  December 05, 2009 11:16 AM

## 2010-03-19 NOTE — Miscellaneous (Signed)
Summary: clinical update/ryan white  Clinical Lists Changes  Observations: Added new observation of PCTFPL: 259.27  (08/29/2009 10:22) Added new observation of HOUSEINCOME: 81191  (08/29/2009 10:22) Added new observation of FINASSESSDT: 08/23/2009  (08/29/2009 10:22) Added new observation of YEARLYEXPEN: 47829  (08/29/2009 10:22)

## 2010-03-19 NOTE — Progress Notes (Signed)
Summary: Patient has questions about form  Phone Note Call from Patient   Caller: Patient Summary of Call: Patient called with some questions about forms being filled out. Patient could not understand why they needed to know if right handed or left handed (patient is right handed). Also patient said that the paperwork that was sent did not state why high BP would keep them from doing their job. Patient states that they have a stressful job. Patient states that the forms filled out needed to talk about why patient could not work with high BP. Patient's callback number is [phone]. Initial call taken by: Kathi Simpers [facility]),  October 11, 2009 9:30 AM  Follow-up for Phone Call        Documents faxed to [employer] at [phone] by Tomasita Morrow, RN forms sent for scanning. Follow-up by: Starleen Arms CMA,  October 26, 2009 4:00 PM

## 2010-03-19 NOTE — Miscellaneous (Signed)
  Clinical Lists Changes  Observations: Added new observation of YEARAIDSPOS: 2009  (01/07/2010 14:34)

## 2010-03-19 NOTE — Letter (Signed)
Summary: [employer]: Disability  [employer]: Disability   Imported By: Florinda Marker 07/26/2009 15:55:55  _____________________________________________________________________  External Attachment:    Type:   Image     Comment:   External Document

## 2010-03-19 NOTE — Progress Notes (Signed)
  Phone Note Call from Patient   Summary of Call: Patient called requesting to be changed to complera. Patient states that Dr. Ninetta Lights mentioned it at the last office visit and she was very interested. Would like script mailed to her home with a 90 day supply. Initial call taken by: Starleen Arms CMA,  October 30, 2009 9:51 AM  Follow-up for Phone Call        would not change her. very highg liklihood she would fail.   Additional Follow-up for Phone Call Additional follow up Details #1::        Genotype 07-12-08:   K103N Genotype 12-21-06  RT: K65R, L100I, K101P, K103S, M184V, K219Q,  PR: K20R, S68G, I135T, I142V.

## 2010-03-19 NOTE — Assessment & Plan Note (Signed)
Summary: f/u for anxiety and bp   CC:  follow-up visit  for anxiety and blood pressure check.  History of Present Illness: 65 yo F with HIV+. 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  Last CD4 380---> 200  VL 69---> 79,800 (12-28-08---> 05-17-09).  Her husband has had multiple spinal surgeries, leak of CSF. taking meds irregularly due to this. asks about new once daily pill (quad). wt up 10#.  recently started on ativan for anxiety/depression. referred to mental health as well.  only takes ativan if not going out of house as it makes it see double. has noted that her BP is fluctuating as her head feels funny. feels like she is retaining fluid in her legs. elavated her legs.  jon is doing better, stitches out yesterday.   Current Medications (verified): 1)  Isentress 400 Mg  Tabs (Raltegravir Potassium) .... Take 1 Tablet By Mouth Two Times A Day 2)  Kaletra 200-50 Mg  Tabs (Lopinavir-Ritonavir) .... 2 Tab By Mouth Bid 3)  Truvada 200-300 Mg  Tabs (Emtricitabine-Tenofovir) .... Take 1 Tablet By Mouth Once A Day 4)  Ativan 1 Mg Tabs (Lorazepam) .... Take 1 Tablet By Mouth Every 8 Hours As Needed  Allergies (verified): 1)  ! Penicillin   Preventive Screening-Counseling & Management  Alcohol-Tobacco     Alcohol drinks/day: occassionally     Alcohol type: wine     Smoking Status: never  Caffeine-Diet-Exercise     Caffeine use/day: coffee and sodas     Does Patient Exercise: no  Safety-Violence-Falls     Seat Belt Use: yes   Updated Prior Medication List: ISENTRESS 400 MG  TABS (RALTEGRAVIR POTASSIUM) Take 1 tablet by mouth two times a day KALETRA 200-50 MG  TABS (LOPINAVIR-RITONAVIR) 2 tab by mouth bid TRUVADA 200-300 MG  TABS (EMTRICITABINE-TENOFOVIR) Take 1 tablet by mouth once a day ATIVAN 1 MG TABS (LORAZEPAM) Take 1 tablet by mouth every 8 hours as needed  Current Allergies (reviewed today): ! PENICILLIN Past History:  Past medical, surgical, family and  social histories (including risk factors) reviewed, and no changes noted (except as noted below).  Past Medical History: Reviewed history from 03/23/2006 and no changes required. HIV disease 2004 Hypertension  Past Surgical History: Reviewed history from 01/15/2009 and no changes required. Hernia repair (adb) 2004 Gastric bypass (06/17/2000)  Family History: Reviewed history from 01/06/2008 and no changes required. husband HIV+ daughter "bipolar, schizophrenic"  Social History: Reviewed history from 01/06/2008 and no changes required. Married, partner positive.  Never Smoked Alcohol use-yes, occas wine  Vital Signs:  Patient profile:   65 year old female Height:      64 inches (162.56 cm) Weight:      211.8 pounds (96.27 kg) BMI:     36.49 Temp:     97.9 degrees F (36.61 degrees C) oral Pulse rate:   62 / minute BP sitting:   156 / 89  (left arm)  Vitals Entered By: Baxter Hire) (August 07, 2009 3:14 PM) CC: follow-up visit  for anxiety and blood pressure check Pain Assessment Patient in pain? no      Nutritional Status BMI of > 30 = obese Nutritional Status Detail appetite is fine per patient  Have you ever been in a relationship where you felt threatened, hurt or afraid?No   Does patient need assistance? Functional Status Self care Ambulation Normal        Medication Adherence: 08/07/2009   Adherence to medications reviewed with patient.  Counseling to provide adequate adherence provided                                Physical Exam  General:  well-developed, well-nourished, well-hydrated, and overweight-appearing.   Eyes:  pupils equal, pupils round, and pupils reactive to light.   Mouth:  pharynx pink and moist and no exudates.   Neck:  no masses.   Lungs:  normal respiratory effort and normal breath sounds.   Heart:  normal rate, regular rhythm, and no murmur.   Abdomen:  soft, non-tender, and normal bowel sounds.     Impression &  Recommendations:  Problem # 1:  HIV DISEASE (ICD-042) she appears to be doing well. will recheck her labs. cont  to be in committed relationship.   Problem # 2:  HYPERTENSION (ICD-401.9)  will start her on HCTZ 50mg  once daily and have her back in 2 weeks for BP and BMP check.   Her updated medication list for this problem includes:    Hydrochlorothiazide 50 Mg Tabs (Hydrochlorothiazide) .Marland Kitchen... Take one (1) by mouth daily  Problem # 3:  ANXIETY STATE, UNSPECIFIED (ICD-300.00) she appears to be improved. will cont her as needed ativan, f/u with mental health to see if SSRI maybehelpful. My great appreciation to them.  Her updated medication list for this problem includes:    Ativan 1 Mg Tabs (Lorazepam) .Marland Kitchen... Take 1 tablet by mouth every 8 hours as needed  Medications Added to Medication List This Visit: 1)  Hydrochlorothiazide 50 Mg Tabs (Hydrochlorothiazide) .... Take one (1) by mouth daily  Other Orders: T-CD4SP [facility]) (CD4SP) T-HIV Viral Load [phone]) T-Comprehensive Metabolic Panel [phone]) T-CBC w/Diff ([id number]) Est. Patient Level IV (57846)  Prescriptions: HYDROCHLOROTHIAZIDE 50 MG TABS (HYDROCHLOROTHIAZIDE) Take one (1) by mouth daily  #30 x 2   Entered and Authorized by:   Johny Sax MD   Signed by:   Johny Sax MD on 08/07/2009   Method used:   Print then Give to Patient   RxID:   [id number]

## 2010-03-21 NOTE — Medication Information (Signed)
Summary: [employer]   Imported By: Florinda Marker 02/26/2010 10:10:24  _____________________________________________________________________  External Attachment:    Type:   Image     Comment:   External Document

## 2010-03-21 NOTE — Progress Notes (Signed)
Summary: Patient calling about letter to [facility]  Phone Note Call from Patient   Caller: Patient Summary of Call: Patient called about needing a letter stating the reason of not be able to go back to work. The letter that was sent was not specific enough for [employer]. Patient states that they have a stressful job and that going back to work would only enhance the stress and cause BP to go high. Patient states that the letter should talk about having time for the BP meds to work so the BP would be under control. This should be from the patient's 08-07-09 visit.  Initial call taken by: Kathi Simpers [facility]),  September 27, 2009 2:22 PM  Follow-up for Phone Call        Pt. called asking about form to [employer].  I called [employer] and requested an extension until next week, because Dr. Ninetta Lights is on vacation. Pt. notified Follow-up by: Wendall Mola CMA Duncan Dull),  October 04, 2009 1:47 PM  Additional Follow-up for Phone Call Additional follow up Details #1::        will address at OV

## 2010-03-21 NOTE — Miscellaneous (Signed)
Summary: labs  Clinical Lists Changes  Orders: Added new Test order of T-CBC w/Diff [phone]) - Signed Added new Test order of T-CD4SP [facility]) (CD4SP) - Signed Added new Test order of T-Comprehensive Metabolic Panel [phone]) - Signed Added new Test order of T-HIV Viral Load [phone]) - Signed     Process Orders Check Orders Results:     [facility]: ABN not required for this insurance Order queued for requisitioning for [facility]: January 31, 2010 1:49 PM  Tests Sent for requisitioning (January 31, 2010 1:49 PM):     01/31/2010: [facility] -- T-CBC w/Diff [[id number]] (signed)     01/31/2010: [facility] -- T-Comprehensive Metabolic Panel [[id number]] (signed)     01/31/2010: [facility] -- T-HIV Viral Load [phone] (signed)

## 2010-03-21 NOTE — Assessment & Plan Note (Signed)
Summary: F/U[MKJ]   CC:  f/u labs.  History of Present Illness: 65 yo F with HIV+, HTN.  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  Last CD4 290,  VL 109 (01-31-10). Her husband has had multiple spinal surgeries, leak of CSF. taking meds irregularly due to this. asks about new once daily pill (quad). wt up 10#.  Has been dx with blood clot in back of R knee and has been on coumadin.   Preventive Screening-Counseling & Management  Alcohol-Tobacco     Alcohol drinks/day: 0     Smoking Status: never  Caffeine-Diet-Exercise     Does Patient Exercise: yes     Type of exercise: walking   Updated Prior Medication List: ISENTRESS 400 MG  TABS (RALTEGRAVIR POTASSIUM) Take 1 tablet by mouth two times a day KALETRA 200-50 MG  TABS (LOPINAVIR-RITONAVIR) 2 tab by mouth bid TRUVADA 200-300 MG  TABS (EMTRICITABINE-TENOFOVIR) Take 1 tablet by mouth once a day HYDROCHLOROTHIAZIDE 50 MG TABS (HYDROCHLOROTHIAZIDE) Take one (1) by mouth daily COUMADIN 7.5 MG TABS (WARFARIN SODIUM) dose varies by INR, alternating 6mg  and 7 mg  Current Allergies (reviewed today): ! PENICILLIN Past History:  Past medical, surgical, family and social histories (including risk factors) reviewed, and no changes noted (except as noted below).  Past Medical History: HIV disease 2004    Genotype 07-12-08 K103N    Genotype 12-21-06 RT: K65R, L100I, K101P, K103S, M184V, K219Q                                  PR: V3I, T12P, K14R, S37N, L63P, V77I, I93L Hypertension R LE DVT  Past Surgical History: Reviewed history from 01/15/2009 and no changes required. Hernia repair (adb) 2004 Gastric bypass (06/17/2000)  Family History: Reviewed history from 01/06/2008 and no changes required. husband HIV+ daughter "bipolar, schizophrenic"  Social History: Reviewed history from 08/07/2009 and no changes required. Married, partner positive.  Never Smoked Alcohol use-no  Review of Systems       has lost  some wt (6#), has been waking with husband. states BP is NL at home. has severe stomach cramps due to ART.   Vital Signs:  Patient profile:   65 year old female Height:      64 inches (162.56 cm) Weight:      205.50 pounds (93.41 kg) BMI:     35.40 Temp:     97.6 degrees F (36.44 degrees C) oral Pulse rate:   67 / minute BP sitting:   157 / 93  (left arm)  Vitals Entered By: Starleen Arms CMA (February 21, 2010 4:20 PM) CC: f/u labs Is Patient Diabetic? No Pain Assessment Patient in pain? no      Nutritional Status BMI of > 30 = obese Nutritional Status Detail nl  Does patient need assistance? Functional Status Self care Ambulation Normal   Physical Exam  General:  well-developed, well-nourished, well-hydrated, and overweight-appearing.   Eyes:  pupils equal, pupils round, and pupils reactive to light.   Mouth:  pharynx pink and moist and no exudates.   Neck:  no masses.   Lungs:  normal respiratory effort and normal breath sounds.   Heart:  normal rate, regular rhythm, and no murmur.   Abdomen:  soft, non-tender, and normal bowel sounds.          Medication Adherence: 02/21/2010   Adherence to medications reviewed with patient. Counseling to provide adequate  adherence provided   Prevention For Positives: 02/21/2010   Safe sex practices discussed with patient. Condoms offered.                             Impression & Recommendations:  Problem # 1:  HIV DISEASE (ICD-042)  had flu shot at work. her numbers are great. she is trying to be adherent. has had nl colonoscopy. may be a candidate for quad pill when it comes out this fall. needs PNVX today. return to clinic 5-6 months.   Orders: Mammogram (Mammogram)  Problem # 2:  HYPERTENSION (ICD-401.9) cont her current meds. will watch at home.  Her updated medication list for this problem includes:    Hydrochlorothiazide 50 Mg Tabs (Hydrochlorothiazide) .Marland Kitchen... 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 needs. and also needs mammo.   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   Medications Added to Medication List This Visit: 1)  Coumadin 7.5 Mg Tabs (Warfarin sodium) .... Dose varies by inr, alternating 6mg  and 7 mg  Other Orders: Est. Patient Level IV (11914) Future Orders: T-CD4SP ([facility]) (CD4SP) ... 08/20/2010 T-HIV Viral Load [phone]) ... 08/20/2010 T-Comprehensive Metabolic Panel [phone]) ... 08/20/2010 T-CBC w/Diff ([id number]) ... 08/20/2010 T-Lipid Profile [phone]) ... 08/20/2010 T-RPR (Syphilis) [phone]) ... 08/20/2010  Prescriptions: ISENTRESS 400 MG  TABS (RALTEGRAVIR POTASSIUM) Take 1 tablet by mouth two times a day  #180 x 6   Entered and Authorized by:   Johny Sax MD   Signed by:   Johny Sax MD on 02/21/2010   Method used:   Print then Give to Patient   RxID:   [id number]    Immunization History:  Influenza Immunization History:    Influenza:  historical (12/18/2009)    Preventive Care Screening  Colonoscopy:    Date:  02/19/2009    Next Due:  02/2019    Results:  normal   Appended Document: F/U + pneumovax   Pneumovax Vaccine    Vaccine Type: Pneumovax    Site: left deltoid    Mfr: Merck    Dose: 0.5 ml    Route: IM    Given by: Jennet Maduro RN    Exp. Date: 06/27/2011    Lot #: [id number]

## 2010-04-01 ENCOUNTER — Encounter: Payer: Self-pay | Admitting: Infectious Diseases

## 2010-04-16 NOTE — Consult Note (Signed)
Summary: [facility] New Pt. Evaluation  [facility] New Pt. Evaluation   Imported By: Florinda Marker 04/09/2010 15:48:29  _____________________________________________________________________  External Attachment:    Type:   Image     Comment:   External Document

## 2010-04-17 ENCOUNTER — Encounter: Payer: Self-pay | Admitting: Infectious Diseases

## 2010-04-18 HISTORY — PX: OTHER SURGICAL HISTORY: SHX169

## 2010-04-29 LAB — T-HELPER CELL (CD4) - (RCID CLINIC ONLY)
CD4 % Helper T Cell: 27 % — ABNORMAL LOW (ref 33–55)
CD4 T Cell Abs: 290 uL — ABNORMAL LOW (ref 400–2700)

## 2010-04-30 NOTE — Medication Information (Signed)
[facility]   Imported By: Florinda Marker 04/25/2010 12:32:27  _____________________________________________________________________  External Attachment:    Type:   Image     Comment:   External Document

## 2010-05-05 LAB — T-HELPER CELL (CD4) - (RCID CLINIC ONLY): CD4 % Helper T Cell: 20 % — ABNORMAL LOW (ref 33–55)

## 2010-05-07 NOTE — Progress Notes (Signed)
Summary: [employer] forms  Phone Note Call from Patient   Caller: Patient Summary of Call: pt. called stating forms were sent from [employer] for her past short term disabiltiy and it needs to be stated on form that the reason pt.was out was stress related. Initial call taken by: Wendall Mola CMA Duncan Dull),  February 22, 2010 12:59 PM

## 2010-05-12 LAB — T-HELPER CELL (CD4) - (RCID CLINIC ONLY)
CD4 % Helper T Cell: 24 % — ABNORMAL LOW (ref 33–55)
CD4 T Cell Abs: 200 uL — ABNORMAL LOW (ref 400–2700)

## 2010-05-22 LAB — T-HELPER CELL (CD4) - (RCID CLINIC ONLY): CD4 % Helper T Cell: 28 % — ABNORMAL LOW (ref 33–55)

## 2010-05-28 LAB — T-HELPER CELL (CD4) - (RCID CLINIC ONLY): CD4 % Helper T Cell: 16 % — ABNORMAL LOW (ref 33–55)

## 2010-07-29 ENCOUNTER — Other Ambulatory Visit: Payer: Self-pay | Admitting: *Deleted

## 2010-07-29 DIAGNOSIS — I10 Essential (primary) hypertension: Secondary | ICD-10-CM

## 2010-07-29 MED ORDER — HYDROCHLOROTHIAZIDE 50 MG PO TABS: 50.0000 mg | ORAL_TABLET | Freq: Every day | ORAL | Status: DC

## 2010-07-31 ENCOUNTER — Other Ambulatory Visit: Payer: Self-pay | Admitting: Licensed Clinical Social Worker

## 2010-07-31 DIAGNOSIS — I10 Essential (primary) hypertension: Secondary | ICD-10-CM

## 2010-08-01 ENCOUNTER — Other Ambulatory Visit: Payer: Self-pay | Admitting: *Deleted

## 2010-08-01 DIAGNOSIS — I10 Essential (primary) hypertension: Secondary | ICD-10-CM

## 2010-08-01 MED ORDER — HYDROCHLOROTHIAZIDE 50 MG PO TABS: 50.0000 mg | ORAL_TABLET | Freq: Every day | ORAL | Status: DC

## 2010-09-03 ENCOUNTER — Encounter (HOSPITAL_BASED_OUTPATIENT_CLINIC_OR_DEPARTMENT_OTHER): Payer: BC Managed Care – PPO | Admitting: Oncology

## 2010-09-03 ENCOUNTER — Other Ambulatory Visit: Payer: Self-pay | Admitting: Oncology

## 2010-09-03 DIAGNOSIS — I803 Phlebitis and thrombophlebitis of lower extremities, unspecified: Secondary | ICD-10-CM

## 2010-09-03 DIAGNOSIS — B20 Human immunodeficiency virus [HIV] disease: Secondary | ICD-10-CM

## 2010-09-03 DIAGNOSIS — I82409 Acute embolism and thrombosis of unspecified deep veins of unspecified lower extremity: Secondary | ICD-10-CM

## 2010-09-03 LAB — CBC WITH DIFFERENTIAL/PLATELET
BASO%: 0.5 % (ref 0.0–2.0)
Eosinophils Absolute: 0.2 10*3/uL (ref 0.0–0.5)
MCHC: 32.3 g/dL (ref 31.5–36.0)
MCV: 76.9 fL — ABNORMAL LOW (ref 79.5–101.0)
MONO#: 0.5 10*3/uL (ref 0.1–0.9)
MONO%: 11.7 % (ref 0.0–14.0)
NEUT#: 2.4 10*3/uL (ref 1.5–6.5)
RBC: 4.67 10*6/uL (ref 3.70–5.45)
RDW: 15.1 % — ABNORMAL HIGH (ref 11.2–14.5)
WBC: 4.3 10*3/uL (ref 3.9–10.3)

## 2010-09-03 LAB — PROTIME-INR
INR: 2 (ref 2.00–3.50)
Protime: 24 Seconds — ABNORMAL HIGH (ref 10.6–13.4)

## 2010-09-10 ENCOUNTER — Encounter (HOSPITAL_BASED_OUTPATIENT_CLINIC_OR_DEPARTMENT_OTHER): Payer: BC Managed Care – PPO | Admitting: Oncology

## 2010-09-10 DIAGNOSIS — I82409 Acute embolism and thrombosis of unspecified deep veins of unspecified lower extremity: Secondary | ICD-10-CM

## 2010-09-10 DIAGNOSIS — I803 Phlebitis and thrombophlebitis of lower extremities, unspecified: Secondary | ICD-10-CM

## 2010-09-10 DIAGNOSIS — B20 Human immunodeficiency virus [HIV] disease: Secondary | ICD-10-CM

## 2010-09-13 ENCOUNTER — Encounter: Payer: Self-pay | Admitting: Family Medicine

## 2010-09-25 ENCOUNTER — Encounter: Payer: Self-pay | Admitting: Family Medicine

## 2010-09-26 ENCOUNTER — Ambulatory Visit (INDEPENDENT_AMBULATORY_CARE_PROVIDER_SITE_OTHER): Payer: BC Managed Care – PPO | Admitting: Family Medicine

## 2010-09-26 ENCOUNTER — Encounter: Payer: Self-pay | Admitting: Family Medicine

## 2010-09-26 VITALS — BP 110/80 | HR 64 | Temp 98.1°F | Ht 63.5 in | Wt 209.8 lb

## 2010-09-26 DIAGNOSIS — B20 Human immunodeficiency virus [HIV] disease: Secondary | ICD-10-CM

## 2010-09-26 DIAGNOSIS — Z1231 Encounter for screening mammogram for malignant neoplasm of breast: Secondary | ICD-10-CM

## 2010-09-26 DIAGNOSIS — I82409 Acute embolism and thrombosis of unspecified deep veins of unspecified lower extremity: Secondary | ICD-10-CM

## 2010-09-26 DIAGNOSIS — D649 Anemia, unspecified: Secondary | ICD-10-CM

## 2010-09-26 DIAGNOSIS — Z9889 Other specified postprocedural states: Secondary | ICD-10-CM

## 2010-09-26 DIAGNOSIS — I10 Essential (primary) hypertension: Secondary | ICD-10-CM

## 2010-09-26 DIAGNOSIS — Z7902 Long term (current) use of antithrombotics/antiplatelets: Secondary | ICD-10-CM

## 2010-09-26 DIAGNOSIS — Z7901 Long term (current) use of anticoagulants: Secondary | ICD-10-CM

## 2010-09-26 MED ORDER — DICLOFENAC SODIUM 1 % TD GEL: 1.0000 "application " | Freq: Four times a day (QID) | TRANSDERMAL | Status: DC

## 2010-09-26 NOTE — Patient Instructions (Addendum)
Check with ID about tetanus shot (Tdap). Pass by Cynthia's office today for referral to mammogram. Call OBGYN to establish and for well woman exam. Call ID to schedule appointment in September. Return in 3 months for infection.

## 2010-09-26 NOTE — Progress Notes (Signed)
Subjective:    Patient ID: [name], female    DOB: [birth year], 65 y.o.   MRN: [id number]  HPI CC: new pt, establish  HTN - on HCTZ 50mg  daily.  For almost 1 year now.  Tolerating med fine.  No HA, vision changes, CP/tightness, SOB, leg swelling.  HIV - only on atripla currently, states Dr. Ninetta Lights has told her this med doesn't work for her however does not want to take other 7 pill regimen.  So only taking atripla.  States waiting until september to make appt with ID.  Denies aids defining illness.  DVT - found 2011.  Started on coumadin since 11/2009.  Now stable with INR checked every month.  Goal 2-3.  Obesity - s/p gastric bypass.  Not taking iron or b12 supplements.  Not taking protein shakes currently.  Trying to incorporate more walking and swimming into diet.  Had roux-en-y gastric bypass surgery 2002.  Preventative: Tetanus - thinks at same time as pneumonia shot but not in records 2012 (Tdap).  Will verify with ID. Pneumonia shot 02/2010 Hep B - completed Flu shot - 2011 Last well woman exam - summer 2006. Has been to Dr. Gerald Leitz, [facility] for GYN/pap. Due for pap smear and mammogram, wants to f/u with OBGYN well woman but would like [place] to refer for mammogram.  Medications and allergies reviewed and updated in chart. Patient Active Problem List  Diagnoses  . HIV DISEASE  . ANXIETY STATE, UNSPECIFIED  . HYPERTENSION  . DYSPLASIA, CERVIX NOS  . ABDOMINAL PAIN, RIGHT LOWER QUADRANT  . HX, PERSONAL, PAST NONCOMPLIANCE  . POSTPROCEDURAL STATUS NEC  . DEEP VENOUS THROMBOPHLEBITIS, LEG, RIGHT   Past Medical History  Diagnosis Date  . 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 HIV (human immunodeficiency virus infection) 2003    Dr. Ninetta Lights  . Superficial thrombophlebitis     recurrent, on voltaren gel  . Gastric bypass status for obesity     h/o morbid obesity  .  HTN (hypertension)   . Asthma     no problems in last few years  . Cervical pain (neck)     h/o bulging disk   Past Surgical History  Procedure Date  . Gastric bypass 06/17/00  . Hernia repair 2004    abd s/p repair  . Tubal ligation 1977  . Cholecystectomy 1990  . Tonsillectomy 1965   History  Substance Use Topics  . Smoking status: Never Smoker   . Smokeless tobacco: Never Used  . Alcohol Use: Yes     Occasional   Family History  Problem Relation Age of Onset  . Bipolar disorder Daughter   . Schizophrenia Daughter   . Cancer Mother     uterine, lung cancer (smoker)  . Coronary artery disease Neg Hx   . Stroke Neg Hx   . Diabetes Neg Hx   . Cancer Maternal Aunt     breast cancer  . Cancer Maternal Uncle     bone cancer   Allergies  Allergen Reactions  . Penicillins     REACTION: edema  . Sulfa Antibiotics Other (See Comments)    Chest pain   Current Outpatient Prescriptions on File Prior to Visit  Medication Sig Dispense Refill  . hydrochlorothiazide 50 MG tablet Take 1 tablet (50 mg total) by mouth daily.  90 tablet  3  . emtricitabine-tenofovir (TRUVADA) 200-300 MG  per tablet Take 1 tablet by mouth daily.        Marland Kitchen lopinavir-ritonavir (KALETRA) 200-50 MG per tablet Take 2 tablets by mouth 2 (two) times daily.        . raltegravir (ISENTRESS) 400 MG tablet Take 400 mg by mouth 2 (two) times daily.         Review of Systems  Constitutional: Negative for fever, chills, activity change, appetite change, fatigue and unexpected weight change.  HENT: Negative for hearing loss and neck pain.   Eyes: Negative for visual disturbance.  Respiratory: Negative for cough, chest tightness, shortness of breath and wheezing.   Cardiovascular: Negative for chest pain, palpitations and leg swelling.  Gastrointestinal: Negative for nausea, vomiting, abdominal pain, diarrhea, constipation, blood in stool and abdominal distention.  Genitourinary: Negative for hematuria and  difficulty urinating.  Musculoskeletal: Negative for myalgias and arthralgias.  Skin: Negative for rash.  Neurological: Negative for dizziness, seizures, syncope and headaches.  Hematological: Does not bruise/bleed easily.  Psychiatric/Behavioral: Negative for dysphoric mood. The patient is not nervous/anxious.        Objective:   Physical Exam  Nursing note and vitals reviewed. Constitutional: She is oriented to person, place, and time. She appears well-developed and well-nourished. No distress.  HENT:  Head: Normocephalic and atraumatic.  Right Ear: External ear normal.  Left Ear: External ear normal.  Nose: Nose normal.  Mouth/Throat: Oropharynx is clear and moist. No oropharyngeal exudate.  Eyes: Conjunctivae and EOM are normal. Pupils are equal, round, and reactive to light. No scleral icterus.  Neck: Normal range of motion. Neck supple. No thyromegaly present.  Cardiovascular: Normal rate, regular rhythm, normal heart sounds and intact distal pulses.   No murmur heard. Pulses:      Radial pulses are 2+ on the right side, and 2+ on the left side.  Pulmonary/Chest: Effort normal and breath sounds normal. No respiratory distress. She has no wheezes. She has no rales.  Abdominal: Soft. Bowel sounds are normal. She exhibits no distension and no mass. There is no tenderness. There is no rebound and no guarding.  Musculoskeletal: Normal range of motion. She exhibits no edema.  Lymphadenopathy:    She has no cervical adenopathy.  Neurological: She is alert and oriented to person, place, and time.       CN grossly intact, station and gait intact  Skin: Skin is warm and dry. No rash noted.  Psychiatric: She has a normal mood and affect. Her behavior is normal. Judgment and thought content normal.          Assessment & Plan:

## 2010-09-27 ENCOUNTER — Telehealth: Payer: Self-pay | Admitting: Family Medicine

## 2010-09-27 DIAGNOSIS — D649 Anemia, unspecified: Secondary | ICD-10-CM | POA: Insufficient documentation

## 2010-09-27 NOTE — Assessment & Plan Note (Signed)
Microcytic anemia when checked last month. Return for further blood work - iron panel and B12.

## 2010-09-27 NOTE — Assessment & Plan Note (Signed)
To start getting INR checked here. Goal 2-3.

## 2010-09-27 NOTE — Assessment & Plan Note (Addendum)
Followed by ID. Will see if Dr. Ninetta Lights wants anything checked when pt returns for blood work.

## 2010-09-27 NOTE — Telephone Encounter (Signed)
Please notify I'd like pt to return at her convenience fasting for blood work. Will check cholesterol levels, iron study, b12 level, and other routine labs.

## 2010-09-27 NOTE — Assessment & Plan Note (Signed)
Stable on 50mg  HCTZ daily. Neg ROS. Continue med. Check blood work when returns fasting.

## 2010-09-27 NOTE — Assessment & Plan Note (Signed)
Not currently on iron or B12.  Had roux-en-y. Check blood work. Check FLP.

## 2010-09-27 NOTE — Telephone Encounter (Signed)
Message left advising patient to call and schedule fasting, routine labs.

## 2010-10-01 ENCOUNTER — Other Ambulatory Visit (INDEPENDENT_AMBULATORY_CARE_PROVIDER_SITE_OTHER): Payer: BC Managed Care – PPO | Admitting: Family Medicine

## 2010-10-01 ENCOUNTER — Telehealth: Payer: Self-pay | Admitting: Family Medicine

## 2010-10-01 DIAGNOSIS — Z9889 Other specified postprocedural states: Secondary | ICD-10-CM

## 2010-10-01 DIAGNOSIS — I10 Essential (primary) hypertension: Secondary | ICD-10-CM

## 2010-10-01 DIAGNOSIS — D649 Anemia, unspecified: Secondary | ICD-10-CM

## 2010-10-01 LAB — LIPID PANEL
Cholesterol: 196 mg/dL (ref 0–200)
VLDL: 31 mg/dL (ref 0.0–40.0)

## 2010-10-01 LAB — TSH: TSH: 2.23 u[IU]/mL (ref 0.35–5.50)

## 2010-10-01 LAB — COMPREHENSIVE METABOLIC PANEL
Albumin: 3.7 g/dL (ref 3.5–5.2)
BUN: 18 mg/dL (ref 6–23)
CO2: 30 mEq/L (ref 19–32)
Calcium: 8.9 mg/dL (ref 8.4–10.5)
Chloride: 98 mEq/L (ref 96–112)
GFR: 92.85 mL/min (ref >60.00)
Glucose, Bld: 110 mg/dL — ABNORMAL HIGH (ref 70–99)
Potassium: 3.3 mEq/L — ABNORMAL LOW (ref 3.5–5.1)
Sodium: 137 mEq/L (ref 135–145)
Total Protein: 7.9 g/dL (ref 6.0–8.3)

## 2010-10-01 LAB — FERRITIN: Ferritin: 7.3 ng/mL — ABNORMAL LOW (ref 10.0–291.0)

## 2010-10-01 LAB — IBC PANEL: Transferrin: 363.8 mg/dL — ABNORMAL HIGH (ref 212.0–360.0)

## 2010-10-01 MED ORDER — FERROUS SULFATE 325 (65 FE) MG PO TABS: 325.0000 mg | ORAL_TABLET | Freq: Every day | ORAL | Status: DC

## 2010-10-01 NOTE — Telephone Encounter (Signed)
Please notify cholesterol levels overall ok, good cholesterol a bit low, best thing to raise is aerobic exercise. Potassium a bit low - recommend more fruits and vegetables. Sugar a bit high - prediabetes range will need to monitor closely. Kidneys, liver, thyroid normal. Iron level low, B12 level low. recommend start iron supplement between meals (placed in chart, OTC) and recommend coming in for weekly b12 shots for 1 month then monthly for 1 year.  Likely due to this that hemoglobin level was low.

## 2010-10-02 ENCOUNTER — Other Ambulatory Visit: Payer: Self-pay | Admitting: *Deleted

## 2010-10-02 MED ORDER — FERROUS SULFATE 325 (65 FE) MG PO TABS: 325.0000 mg | ORAL_TABLET | Freq: Every day | ORAL | Status: DC

## 2010-10-02 NOTE — Telephone Encounter (Signed)
Message left for patient to return my call.  

## 2010-10-02 NOTE — Telephone Encounter (Signed)
Re-sent Rx to [facility] instead of mail order per patient request.

## 2010-10-02 NOTE — Telephone Encounter (Signed)
Patient notified and B-12 injection scheduled. Iron re-sent to [facility] [address] so she wouldn't have to wait on the mail order. Med list updated.

## 2010-10-03 ENCOUNTER — Ambulatory Visit (INDEPENDENT_AMBULATORY_CARE_PROVIDER_SITE_OTHER): Payer: BC Managed Care – PPO | Admitting: *Deleted

## 2010-10-03 DIAGNOSIS — D649 Anemia, unspecified: Secondary | ICD-10-CM

## 2010-10-03 MED ORDER — CYANOCOBALAMIN 1000 MCG/ML IJ SOLN
1000.0000 ug | Freq: Once | INTRAMUSCULAR | Status: AC
  Administered 2010-10-03: 1000 ug via INTRAMUSCULAR

## 2010-10-03 NOTE — Telephone Encounter (Signed)
Script called to [facility].

## 2010-10-08 ENCOUNTER — Ambulatory Visit (INDEPENDENT_AMBULATORY_CARE_PROVIDER_SITE_OTHER): Payer: BC Managed Care – PPO | Admitting: Family Medicine

## 2010-10-08 DIAGNOSIS — D649 Anemia, unspecified: Secondary | ICD-10-CM

## 2010-10-08 MED ORDER — CYANOCOBALAMIN 1000 MCG/ML IJ SOLN
1000.0000 ug | Freq: Once | INTRAMUSCULAR | Status: AC
  Administered 2010-10-08: 1000 ug via INTRAMUSCULAR

## 2010-10-08 NOTE — Progress Notes (Signed)
Vitamin B12 injection given during nurse visit today. 

## 2010-10-10 ENCOUNTER — Ambulatory Visit
Admission: RE | Admit: 2010-10-10 | Discharge: 2010-10-10 | Disposition: A | Discharge status: Home / Self Care | Payer: BC Managed Care – PPO | Source: Ambulatory Visit | Attending: Family Medicine | Admitting: Family Medicine

## 2010-10-10 DIAGNOSIS — Z1231 Encounter for screening mammogram for malignant neoplasm of breast: Secondary | ICD-10-CM

## 2010-10-11 ENCOUNTER — Encounter: Payer: Self-pay | Admitting: *Deleted

## 2010-10-17 ENCOUNTER — Ambulatory Visit (INDEPENDENT_AMBULATORY_CARE_PROVIDER_SITE_OTHER): Payer: BC Managed Care – PPO | Admitting: Family Medicine

## 2010-10-17 DIAGNOSIS — E538 Deficiency of other specified B group vitamins: Secondary | ICD-10-CM

## 2010-10-17 MED ORDER — CYANOCOBALAMIN 1000 MCG/ML IJ SOLN
1000.0000 ug | Freq: Once | INTRAMUSCULAR | Status: AC
  Administered 2010-10-17: 1000 ug via INTRAMUSCULAR

## 2010-10-17 NOTE — Progress Notes (Signed)
B-12 given IM left deltoid without incident.

## 2010-10-24 ENCOUNTER — Ambulatory Visit (INDEPENDENT_AMBULATORY_CARE_PROVIDER_SITE_OTHER): Payer: BC Managed Care – PPO | Admitting: Family Medicine

## 2010-10-24 DIAGNOSIS — Z7901 Long term (current) use of anticoagulants: Secondary | ICD-10-CM

## 2010-10-24 DIAGNOSIS — Z7902 Long term (current) use of antithrombotics/antiplatelets: Secondary | ICD-10-CM

## 2010-10-24 DIAGNOSIS — I82409 Acute embolism and thrombosis of unspecified deep veins of unspecified lower extremity: Secondary | ICD-10-CM

## 2010-10-24 DIAGNOSIS — D649 Anemia, unspecified: Secondary | ICD-10-CM

## 2010-10-24 MED ORDER — CYANOCOBALAMIN 1000 MCG/ML IJ SOLN
1000.0000 ug | Freq: Once | INTRAMUSCULAR | Status: AC
  Administered 2010-10-24: 1000 ug via INTRAMUSCULAR

## 2010-10-24 NOTE — Progress Notes (Signed)
B12 injection given during nurse visit today. 

## 2010-10-24 NOTE — Patient Instructions (Signed)
Continue 5 mg daily, recheck 4 weeks 

## 2010-11-07 LAB — T-HELPER CELL (CD4) - (RCID CLINIC ONLY): CD4 % Helper T Cell: 17 — ABNORMAL LOW

## 2010-11-12 LAB — T-HELPER CELL (CD4) - (RCID CLINIC ONLY): CD4 T Cell Abs: 290 — ABNORMAL LOW

## 2010-11-19 LAB — T-HELPER CELL (CD4) - (RCID CLINIC ONLY)
CD4 % Helper T Cell: 29 — ABNORMAL LOW
CD4 T Cell Abs: 350 — ABNORMAL LOW

## 2010-11-21 ENCOUNTER — Ambulatory Visit: Payer: BC Managed Care – PPO

## 2010-11-26 ENCOUNTER — Ambulatory Visit (INDEPENDENT_AMBULATORY_CARE_PROVIDER_SITE_OTHER): Payer: BC Managed Care – PPO | Admitting: *Deleted

## 2010-11-26 ENCOUNTER — Ambulatory Visit: Payer: BC Managed Care – PPO

## 2010-11-26 ENCOUNTER — Ambulatory Visit (INDEPENDENT_AMBULATORY_CARE_PROVIDER_SITE_OTHER): Payer: BC Managed Care – PPO | Admitting: Family Medicine

## 2010-11-26 ENCOUNTER — Ambulatory Visit (INDEPENDENT_AMBULATORY_CARE_PROVIDER_SITE_OTHER): Payer: BC Managed Care – PPO

## 2010-11-26 DIAGNOSIS — Z7902 Long term (current) use of antithrombotics/antiplatelets: Secondary | ICD-10-CM

## 2010-11-26 DIAGNOSIS — I82409 Acute embolism and thrombosis of unspecified deep veins of unspecified lower extremity: Secondary | ICD-10-CM

## 2010-11-26 DIAGNOSIS — D649 Anemia, unspecified: Secondary | ICD-10-CM

## 2010-11-26 DIAGNOSIS — Z7901 Long term (current) use of anticoagulants: Secondary | ICD-10-CM

## 2010-11-26 DIAGNOSIS — Z23 Encounter for immunization: Secondary | ICD-10-CM

## 2010-11-26 LAB — T-HELPER CELL (CD4) - (RCID CLINIC ONLY)
CD4 % Helper T Cell: 20 — ABNORMAL LOW
CD4 T Cell Abs: 240 — ABNORMAL LOW

## 2010-11-26 LAB — POCT INR: INR: 1.6

## 2010-11-26 MED ORDER — CYANOCOBALAMIN 1000 MCG/ML IJ SOLN
1000.0000 ug | Freq: Once | INTRAMUSCULAR | Status: AC
  Administered 2010-11-26: 1000 ug via INTRAMUSCULAR

## 2010-11-26 NOTE — Patient Instructions (Signed)
5 mg daily, except 7.5 mg every Tues ( increased dose 2.5 mg ) check 1 week

## 2010-12-03 ENCOUNTER — Ambulatory Visit: Payer: BC Managed Care – PPO

## 2010-12-05 ENCOUNTER — Ambulatory Visit (INDEPENDENT_AMBULATORY_CARE_PROVIDER_SITE_OTHER): Payer: BC Managed Care – PPO | Admitting: Family Medicine

## 2010-12-05 DIAGNOSIS — Z7902 Long term (current) use of antithrombotics/antiplatelets: Secondary | ICD-10-CM

## 2010-12-05 DIAGNOSIS — I82409 Acute embolism and thrombosis of unspecified deep veins of unspecified lower extremity: Secondary | ICD-10-CM

## 2010-12-05 DIAGNOSIS — Z7901 Long term (current) use of anticoagulants: Secondary | ICD-10-CM

## 2010-12-05 NOTE — Patient Instructions (Signed)
Continue current dose, check in 4 weeks  

## 2010-12-22 ENCOUNTER — Encounter: Payer: Self-pay | Admitting: Family Medicine

## 2010-12-24 ENCOUNTER — Ambulatory Visit (INDEPENDENT_AMBULATORY_CARE_PROVIDER_SITE_OTHER): Payer: BC Managed Care – PPO | Admitting: *Deleted

## 2010-12-24 DIAGNOSIS — E538 Deficiency of other specified B group vitamins: Secondary | ICD-10-CM

## 2010-12-24 MED ORDER — CYANOCOBALAMIN 1000 MCG/ML IJ SOLN
1000.0000 ug | Freq: Once | INTRAMUSCULAR | Status: AC
  Administered 2010-12-24: 1000 ug via INTRAMUSCULAR

## 2010-12-31 ENCOUNTER — Encounter: Payer: Self-pay | Admitting: Family Medicine

## 2010-12-31 ENCOUNTER — Ambulatory Visit (INDEPENDENT_AMBULATORY_CARE_PROVIDER_SITE_OTHER): Payer: BC Managed Care – PPO | Admitting: Family Medicine

## 2010-12-31 VITALS — BP 122/82 | HR 76 | Temp 98.0°F | Wt 211.5 lb

## 2010-12-31 DIAGNOSIS — I82409 Acute embolism and thrombosis of unspecified deep veins of unspecified lower extremity: Secondary | ICD-10-CM

## 2010-12-31 DIAGNOSIS — I10 Essential (primary) hypertension: Secondary | ICD-10-CM

## 2010-12-31 DIAGNOSIS — Z7901 Long term (current) use of anticoagulants: Secondary | ICD-10-CM

## 2010-12-31 DIAGNOSIS — Z7902 Long term (current) use of antithrombotics/antiplatelets: Secondary | ICD-10-CM

## 2010-12-31 DIAGNOSIS — B20 Human immunodeficiency virus [HIV] disease: Secondary | ICD-10-CM

## 2010-12-31 DIAGNOSIS — D649 Anemia, unspecified: Secondary | ICD-10-CM

## 2010-12-31 DIAGNOSIS — E611 Iron deficiency: Secondary | ICD-10-CM

## 2010-12-31 DIAGNOSIS — E538 Deficiency of other specified B group vitamins: Secondary | ICD-10-CM

## 2010-12-31 NOTE — Assessment & Plan Note (Addendum)
F/u with ID upcoming. Only taking atripla.

## 2010-12-31 NOTE — Progress Notes (Signed)
  Subjective:    Patient ID: [name], female    DOB: April 16, 1945, 65 y.o.   MRN: [id number]  HPI CC: 3 mo f/u  HIV - taking husband's atripla daily, not currently taking meds that ID prescribes for her.  "Too many per day".  Has appt with Dr. Ninetta Lights coming up 01/23/2011, blood work this week.  DVT - on coumadin, lat INR was last month and 2.5.  No bleeding noted.  S/p gastric bypass: Vit B12 def - taking monthly shots.  Didn't notice improvement in energy. Iron def - Taking iron as well one daily.  No fevers/chills, cough, congestion.  Had flu shot at beginning of year. Due for pap - will call and schedule with OBGYN.  Wt Readings from Last 3 Encounters:  12/31/10 211 lb 8 oz (95.936 kg)  09/26/10 209 lb 12 oz (95.142 kg)  02/21/10 205 lb 8 oz (93.214 kg)  There is no height on file to calculate BMI.  Past Medical History  Diagnosis Date  . 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 HIV (human immunodeficiency virus infection) 2003    Dr. Ninetta Lights  . Superficial thrombophlebitis     recurrent, on voltaren gel  . Gastric bypass status for obesity     h/o morbid obesity  . HTN (hypertension)   . Asthma     no problems in last few years  . Cervical pain (neck)     h/o bulging disk   Review of Systems Per HPI    Objective:   Physical Exam  Nursing note and vitals reviewed. Constitutional: She appears well-developed and well-nourished. No distress.  HENT:  Head: Normocephalic and atraumatic.  Mouth/Throat: Oropharynx is clear and moist. No oropharyngeal exudate.  Eyes: Conjunctivae and EOM are normal. Pupils are equal, round, and reactive to light. No scleral icterus.  Neck: Normal range of motion. Neck supple.  Cardiovascular: Normal rate, regular rhythm, normal heart sounds and intact distal pulses.   No murmur heard. Pulses:      Radial pulses are 2+ on the  right side, and 2+ on the left side.  Pulmonary/Chest: Effort normal and breath sounds normal. No respiratory distress. She has no wheezes. She has no rales.  Skin: Skin is warm and dry. No rash noted.  Psychiatric: She has a normal mood and affect.       Assessment & Plan:

## 2010-12-31 NOTE — Assessment & Plan Note (Signed)
Stable, chronic.  Continue HCTZ 50mg  daily.

## 2010-12-31 NOTE — Assessment & Plan Note (Signed)
On iron daily. S/p gastric bypass.

## 2010-12-31 NOTE — Patient Instructions (Addendum)
When you have blood drawn on Thursday, we will check iron and B12 level as well. Good to see you today, call us with questions. Return as needed or in 6 mo to 1 year.

## 2010-12-31 NOTE — Patient Instructions (Signed)
Continue current dose, check in 4 weeks  

## 2010-12-31 NOTE — Assessment & Plan Note (Signed)
Goal INR 2-3

## 2010-12-31 NOTE — Assessment & Plan Note (Signed)
Check B12 next blood draw.  On monthly shot.

## 2011-01-02 ENCOUNTER — Other Ambulatory Visit: Payer: BC Managed Care – PPO

## 2011-01-02 DIAGNOSIS — B20 Human immunodeficiency virus [HIV] disease: Secondary | ICD-10-CM

## 2011-01-02 LAB — CBC WITH DIFFERENTIAL/PLATELET
Eosinophils Absolute: 0.1 10*3/uL (ref 0.0–0.7)
Eosinophils Relative: 3 % (ref 0–5)
Hemoglobin: 13.8 g/dL (ref 12.0–15.0)
Lymphs Abs: 0.9 10*3/uL (ref 0.7–4.0)
MCH: 28.2 pg (ref 26.0–34.0)
MCV: 85.5 fL (ref 78.0–100.0)
Monocytes Relative: 8 % (ref 3–12)
Neutrophils Relative %: 57 % (ref 43–77)
RBC: 4.9 MIL/uL (ref 3.87–5.11)

## 2011-01-02 LAB — LIPID PANEL
Cholesterol: 190 mg/dL (ref 0–200)
LDL Cholesterol: 114 mg/dL — ABNORMAL HIGH (ref 0–99)
VLDL: 41 mg/dL — ABNORMAL HIGH (ref 0–40)

## 2011-01-02 NOTE — Progress Notes (Signed)
Addended by: Mariea Clonts D on: 01/02/2011 11:38 AM   Modules accepted: Orders

## 2011-01-03 LAB — COMPLETE METABOLIC PANEL WITH GFR
CO2: 25 mEq/L (ref 19–32)
Calcium: 9.3 mg/dL (ref 8.4–10.5)
GFR, Est African American: >89 mL/min/{1.73_m2}
GFR, Est Non African American: >89 mL/min/{1.73_m2}
Glucose, Bld: 94 mg/dL (ref 70–99)
Sodium: 139 mEq/L (ref 135–145)
Total Bilirubin: 0.3 mg/dL (ref 0.3–1.2)
Total Protein: 7.4 g/dL (ref 6.0–8.3)

## 2011-01-03 LAB — T-HELPER CELL (CD4) - (RCID CLINIC ONLY)
CD4 % Helper T Cell: 22 % — ABNORMAL LOW (ref 33–55)
CD4 T Cell Abs: 210 uL — ABNORMAL LOW (ref 400–2700)

## 2011-01-23 ENCOUNTER — Ambulatory Visit (INDEPENDENT_AMBULATORY_CARE_PROVIDER_SITE_OTHER): Payer: BC Managed Care – PPO | Admitting: Family Medicine

## 2011-01-23 ENCOUNTER — Encounter: Payer: Self-pay | Admitting: Infectious Diseases

## 2011-01-23 ENCOUNTER — Ambulatory Visit (INDEPENDENT_AMBULATORY_CARE_PROVIDER_SITE_OTHER): Payer: BC Managed Care – PPO | Admitting: Infectious Diseases

## 2011-01-23 ENCOUNTER — Other Ambulatory Visit: Payer: Self-pay | Admitting: *Deleted

## 2011-01-23 ENCOUNTER — Other Ambulatory Visit: Payer: BC Managed Care – PPO

## 2011-01-23 ENCOUNTER — Ambulatory Visit (INDEPENDENT_AMBULATORY_CARE_PROVIDER_SITE_OTHER): Payer: BC Managed Care – PPO | Admitting: *Deleted

## 2011-01-23 VITALS — BP 136/82 | HR 66 | Temp 98.1°F | Ht 63.5 in | Wt 213.0 lb

## 2011-01-23 DIAGNOSIS — I82409 Acute embolism and thrombosis of unspecified deep veins of unspecified lower extremity: Secondary | ICD-10-CM

## 2011-01-23 DIAGNOSIS — Z7901 Long term (current) use of anticoagulants: Secondary | ICD-10-CM

## 2011-01-23 DIAGNOSIS — Z5181 Encounter for therapeutic drug level monitoring: Secondary | ICD-10-CM

## 2011-01-23 DIAGNOSIS — E538 Deficiency of other specified B group vitamins: Secondary | ICD-10-CM

## 2011-01-23 DIAGNOSIS — D649 Anemia, unspecified: Secondary | ICD-10-CM

## 2011-01-23 DIAGNOSIS — Z7902 Long term (current) use of antithrombotics/antiplatelets: Secondary | ICD-10-CM

## 2011-01-23 DIAGNOSIS — B20 Human immunodeficiency virus [HIV] disease: Secondary | ICD-10-CM

## 2011-01-23 DIAGNOSIS — N879 Dysplasia of cervix uteri, unspecified: Secondary | ICD-10-CM

## 2011-01-23 LAB — CBC WITH DIFFERENTIAL/PLATELET
Basophils Absolute: 0 10*3/uL (ref 0.0–0.1)
Basophils Relative: 0.4 % (ref 0.0–3.0)
Eosinophils Absolute: 0.1 10*3/uL (ref 0.0–0.7)
HCT: 43.2 % (ref 36.0–46.0)
Hemoglobin: 14.6 g/dL (ref 12.0–15.0)
Lymphs Abs: 1.1 10*3/uL (ref 0.7–4.0)
MCHC: 33.9 g/dL (ref 30.0–36.0)
MCV: 86.9 fl (ref 78.0–100.0)
Neutro Abs: 1.8 10*3/uL (ref 1.4–7.7)
RDW: 15.8 % — ABNORMAL HIGH (ref 11.5–14.6)

## 2011-01-23 LAB — VITAMIN B12: Vitamin B-12: 381 pg/mL (ref 211–911)

## 2011-01-23 LAB — IBC PANEL: Saturation Ratios: 33.3 % (ref 20.0–50.0)

## 2011-01-23 MED ORDER — EMTRICITAB-RILPIVIR-TENOFOV DF 200-25-300 MG PO TABS: 1.0000 | ORAL_TABLET | Freq: Every day | ORAL | Status: DC

## 2011-01-23 MED ORDER — DARUNAVIR ETHANOLATE 400 MG PO TABS: 800.0000 mg | ORAL_TABLET | Freq: Every day | ORAL | Status: DC

## 2011-01-23 MED ORDER — CYANOCOBALAMIN 1000 MCG/ML IJ SOLN
1000.0000 ug | Freq: Once | INTRAMUSCULAR | Status: AC
  Administered 2011-01-23: 1000 ug via INTRAMUSCULAR

## 2011-01-23 MED ORDER — ALBUTEROL 90 MCG/ACT IN AERS: 2.0000 | INHALATION_SPRAY | Freq: Four times a day (QID) | RESPIRATORY_TRACT | Status: DC | PRN

## 2011-01-23 MED ORDER — RITONAVIR 100 MG PO TABS: 100.0000 mg | ORAL_TABLET | Freq: Every day | ORAL | Status: DC

## 2011-01-23 NOTE — Patient Instructions (Signed)
Patient is having dental surgery in 1 week and will start holding coumadin tomorrow in preparation. I will recheck 2 weeks post dental surgery on 02-13-11

## 2011-01-23 NOTE — Telephone Encounter (Signed)
There is no voltaren cream that I know of.  Can she bring in her previous tube to compare? [facility]

## 2011-01-23 NOTE — Telephone Encounter (Signed)
Message left notifying patient. Advised to bring tube/container if able or to call me back and let me know if she was willing to use the gel. Will await return call.

## 2011-01-23 NOTE — Assessment & Plan Note (Signed)
She will cont on coumadin as directed by h/o.

## 2011-01-23 NOTE — Assessment & Plan Note (Signed)
Will f/u for repeat PAP in 2013.

## 2011-01-23 NOTE — Assessment & Plan Note (Signed)
As usual she asks about simpler medications. Will try to change her to complera/DRVr. She cannot remember taking DRV before. 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 Hopefully a potent PI will help suppress her. Hopefully she will not pass a resistant virus to her husband. They refused condoms, are not active. Will see her back in 3 months with labs prior.

## 2011-01-23 NOTE — Progress Notes (Signed)
  Subjective:    Patient ID: [name], female    DOB: [birth year], 65 y.o.   MRN: [id number]  HPI 65 yo F with HTN and HIV+. 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  Last CD4 210, VL 41,600 (01-02-11). Mammo negative August 2012.  Has been taking meds irregularly.      Review of Systems     Objective:   Physical Exam  Constitutional: She appears well-developed and well-nourished.  Cardiovascular: Normal rate, regular rhythm and normal heart sounds.   Pulmonary/Chest: Effort normal and breath sounds normal.  Abdominal: Soft. Bowel sounds are normal. There is no tenderness.          Assessment & Plan:

## 2011-01-23 NOTE — Telephone Encounter (Signed)
Patient came in for a nurse visit and was asking about refills for her Voltaren (cream) not gel and also for albuterol inhaler ( not on med list) please advise. Pt stated these should have been done at her OV? [facility]ses [facility]

## 2011-01-28 ENCOUNTER — Ambulatory Visit: Payer: BC Managed Care – PPO

## 2011-01-30 ENCOUNTER — Telehealth: Payer: Self-pay | Admitting: Infectious Diseases

## 2011-01-30 NOTE — Telephone Encounter (Signed)
Received call from Ms Carelli concerning her Complera.    Called [employer] to see how she goes about activating the co-pay card.  Patient came in and picked up the card.

## 2011-02-06 ENCOUNTER — Ambulatory Visit: Payer: BC Managed Care – PPO | Admitting: Infectious Diseases

## 2011-02-13 ENCOUNTER — Other Ambulatory Visit: Payer: Self-pay | Admitting: Family Medicine

## 2011-02-13 ENCOUNTER — Ambulatory Visit (INDEPENDENT_AMBULATORY_CARE_PROVIDER_SITE_OTHER): Payer: BC Managed Care – PPO | Admitting: Family Medicine

## 2011-02-13 DIAGNOSIS — Z7902 Long term (current) use of antithrombotics/antiplatelets: Secondary | ICD-10-CM

## 2011-02-13 DIAGNOSIS — I82409 Acute embolism and thrombosis of unspecified deep veins of unspecified lower extremity: Secondary | ICD-10-CM

## 2011-02-13 DIAGNOSIS — Z7901 Long term (current) use of anticoagulants: Secondary | ICD-10-CM

## 2011-02-13 NOTE — Telephone Encounter (Signed)
OK 

## 2011-02-13 NOTE — Patient Instructions (Signed)
Take 7.5 mg today then resume, current dose, check in 4 weeks

## 2011-02-13 NOTE — Telephone Encounter (Signed)
Continue given h/o gastric bypass

## 2011-02-13 NOTE — Telephone Encounter (Signed)
Do you want her to continue this or can she stop since levels are normal now?

## 2011-02-17 ENCOUNTER — Other Ambulatory Visit: Payer: Self-pay | Admitting: *Deleted

## 2011-02-17 ENCOUNTER — Telehealth: Payer: Self-pay | Admitting: *Deleted

## 2011-02-17 MED ORDER — DICLOFENAC SODIUM 1 % TD GEL: 1.0000 "application " | Freq: Four times a day (QID) | TRANSDERMAL | Status: DC

## 2011-02-17 NOTE — Telephone Encounter (Signed)
PA form in your IN box for Voltaren gel.

## 2011-02-17 NOTE — Telephone Encounter (Signed)
Patient called requesting refill

## 2011-02-18 NOTE — Telephone Encounter (Signed)
Filled and placed in my out box. 

## 2011-02-19 NOTE — Telephone Encounter (Signed)
Form faxed

## 2011-02-25 ENCOUNTER — Ambulatory Visit: Payer: BC Managed Care – PPO

## 2011-02-27 ENCOUNTER — Ambulatory Visit (INDEPENDENT_AMBULATORY_CARE_PROVIDER_SITE_OTHER): Payer: BC Managed Care – PPO

## 2011-02-27 DIAGNOSIS — E538 Deficiency of other specified B group vitamins: Secondary | ICD-10-CM

## 2011-02-27 MED ORDER — CYANOCOBALAMIN 1000 MCG/ML IJ SOLN
1000.0000 ug | Freq: Once | INTRAMUSCULAR | Status: AC
  Administered 2011-02-27: 1000 ug via INTRAMUSCULAR

## 2011-03-11 ENCOUNTER — Ambulatory Visit (INDEPENDENT_AMBULATORY_CARE_PROVIDER_SITE_OTHER): Payer: BC Managed Care – PPO | Admitting: Family Medicine

## 2011-03-11 DIAGNOSIS — Z7901 Long term (current) use of anticoagulants: Secondary | ICD-10-CM

## 2011-03-11 DIAGNOSIS — I82409 Acute embolism and thrombosis of unspecified deep veins of unspecified lower extremity: Secondary | ICD-10-CM

## 2011-03-11 DIAGNOSIS — Z7902 Long term (current) use of antithrombotics/antiplatelets: Secondary | ICD-10-CM

## 2011-03-11 NOTE — Patient Instructions (Signed)
5 mg daily, except 7.5 mg every Tue, Thurs (increased 2.5 mg weekly)

## 2011-03-25 ENCOUNTER — Ambulatory Visit (INDEPENDENT_AMBULATORY_CARE_PROVIDER_SITE_OTHER): Payer: BC Managed Care – PPO | Admitting: Family Medicine

## 2011-03-25 DIAGNOSIS — I82409 Acute embolism and thrombosis of unspecified deep veins of unspecified lower extremity: Secondary | ICD-10-CM

## 2011-03-25 DIAGNOSIS — Z7902 Long term (current) use of antithrombotics/antiplatelets: Secondary | ICD-10-CM

## 2011-03-25 DIAGNOSIS — Z7901 Long term (current) use of anticoagulants: Secondary | ICD-10-CM

## 2011-03-25 NOTE — Patient Instructions (Signed)
Continue current dose, check in 4 weeks  

## 2011-04-03 ENCOUNTER — Ambulatory Visit (INDEPENDENT_AMBULATORY_CARE_PROVIDER_SITE_OTHER): Payer: BC Managed Care – PPO | Admitting: *Deleted

## 2011-04-03 DIAGNOSIS — E538 Deficiency of other specified B group vitamins: Secondary | ICD-10-CM

## 2011-04-03 MED ORDER — CYANOCOBALAMIN 1000 MCG/ML IJ SOLN
1000.0000 ug | Freq: Once | INTRAMUSCULAR | Status: AC
  Administered 2011-04-03: 1000 ug via INTRAMUSCULAR

## 2011-04-03 NOTE — Progress Notes (Signed)
Here for B12. Right deltoid IM without incident.

## 2011-04-15 ENCOUNTER — Other Ambulatory Visit: Payer: BC Managed Care – PPO

## 2011-04-15 ENCOUNTER — Other Ambulatory Visit: Payer: Self-pay | Admitting: *Deleted

## 2011-04-15 DIAGNOSIS — B20 Human immunodeficiency virus [HIV] disease: Secondary | ICD-10-CM

## 2011-04-16 LAB — COMPLETE METABOLIC PANEL WITH GFR
ALT: 17 U/L (ref 0–35)
AST: 27 U/L (ref 0–37)
Albumin: 3.8 g/dL (ref 3.5–5.2)
Calcium: 8.9 mg/dL (ref 8.4–10.5)
Chloride: 104 mEq/L (ref 96–112)
Potassium: 3.7 mEq/L (ref 3.5–5.3)
Sodium: 140 mEq/L (ref 135–145)
Total Protein: 6.8 g/dL (ref 6.0–8.3)

## 2011-04-16 LAB — CBC
HCT: 41.4 % (ref 36.0–46.0)
MCH: 30.7 pg (ref 26.0–34.0)
MCV: 93.5 fL (ref 78.0–100.0)
RBC: 4.43 MIL/uL (ref 3.87–5.11)
WBC: 3.4 10*3/uL — ABNORMAL LOW (ref 4.0–10.5)

## 2011-04-16 LAB — LIPID PANEL
LDL Cholesterol: 115 mg/dL — ABNORMAL HIGH (ref 0–99)
Triglycerides: 190 mg/dL — ABNORMAL HIGH (ref <150)
VLDL: 38 mg/dL (ref 0–40)

## 2011-04-16 LAB — T-HELPER CELL (CD4) - (RCID CLINIC ONLY): CD4 % Helper T Cell: 25 % — ABNORMAL LOW (ref 33–55)

## 2011-04-22 ENCOUNTER — Ambulatory Visit (INDEPENDENT_AMBULATORY_CARE_PROVIDER_SITE_OTHER): Payer: BC Managed Care – PPO | Admitting: Family Medicine

## 2011-04-22 DIAGNOSIS — I82409 Acute embolism and thrombosis of unspecified deep veins of unspecified lower extremity: Secondary | ICD-10-CM

## 2011-04-22 DIAGNOSIS — Z7901 Long term (current) use of anticoagulants: Secondary | ICD-10-CM

## 2011-04-22 DIAGNOSIS — Z7902 Long term (current) use of antithrombotics/antiplatelets: Secondary | ICD-10-CM

## 2011-04-22 NOTE — Patient Instructions (Signed)
Continue 5 mg daily, except 7.5 mg every Tue, Thurs , recheck 4 weeks

## 2011-04-29 ENCOUNTER — Ambulatory Visit (INDEPENDENT_AMBULATORY_CARE_PROVIDER_SITE_OTHER): Payer: BC Managed Care – PPO | Admitting: Infectious Diseases

## 2011-04-29 ENCOUNTER — Encounter: Payer: Self-pay | Admitting: Infectious Diseases

## 2011-04-29 DIAGNOSIS — I82409 Acute embolism and thrombosis of unspecified deep veins of unspecified lower extremity: Secondary | ICD-10-CM

## 2011-04-29 DIAGNOSIS — I10 Essential (primary) hypertension: Secondary | ICD-10-CM

## 2011-04-29 DIAGNOSIS — B20 Human immunodeficiency virus [HIV] disease: Secondary | ICD-10-CM

## 2011-04-29 NOTE — Progress Notes (Signed)
  Subjective:    Patient ID: [name], female    DOB: 1945/12/09, 66 y.o.   MRN: [id number]  HPI 66 yo F with HTN, cervical dysplasia, DVT (~2 years ago) and HIV+. 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  Has lost 5# last week, doing diet, exercise at [facility].  Has not missed any ART. Takes DRVr in am and complera in pm. Not sure if she needs HCTZ, states it gets worse when she gets stressed out. Continues to take coumadin. Ammenhrreic, needs repeat PAP.    HIV 1 RNA Quant (copies/mL)  Date Value  04/15/2011 50*  01/02/2011 41600*  01/31/2010 109*     CD4 T Cell Abs (cmm)  Date Value  04/15/2011 250*  01/02/2011 210*  01/31/2010 290*       Review of Systems  Constitutional: Negative for appetite change and unexpected weight change.  Gastrointestinal: Negative for diarrhea and constipation.  Genitourinary: Negative for dysuria and menstrual problem.  Neurological: Negative for headaches.       Objective:   Physical Exam  Constitutional: She appears well-developed and well-nourished.  Eyes: EOM are normal. Pupils are equal, round, and reactive to light.  Neck: Neck supple.  Cardiovascular: Normal rate, regular rhythm and normal heart sounds.   Pulmonary/Chest: Effort normal and breath sounds normal.  Abdominal: Soft. Bowel sounds are normal. She exhibits no distension.  Lymphadenopathy:    She has no cervical adenopathy.          Assessment & Plan:

## 2011-04-29 NOTE — Assessment & Plan Note (Addendum)
Will watch closely as RPV and DRVr may interact, as well as TFV may interact. Watch LFTS and CR.  She will rtc in 4-5 months. Her vaccines are up to date. Will check labs prior to her visit. She is offered  Condoms which she refuses.

## 2011-04-29 NOTE — Assessment & Plan Note (Signed)
She wants to quit her anti-htn rx. i suggested she check with Dr Sharen Hones about this. If she stops, i advised her to check it frequently at pharmacy or her home monitor.

## 2011-04-29 NOTE — Assessment & Plan Note (Signed)
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  My great appreciation to Dr Sharen Hones for partnering with me.

## 2011-05-01 ENCOUNTER — Ambulatory Visit (INDEPENDENT_AMBULATORY_CARE_PROVIDER_SITE_OTHER): Payer: BC Managed Care – PPO | Admitting: *Deleted

## 2011-05-01 DIAGNOSIS — E538 Deficiency of other specified B group vitamins: Secondary | ICD-10-CM

## 2011-05-01 MED ORDER — CYANOCOBALAMIN 1000 MCG/ML IJ SOLN
1000.0000 ug | Freq: Once | INTRAMUSCULAR | Status: AC
  Administered 2011-05-01: 1000 ug via INTRAMUSCULAR

## 2011-05-20 ENCOUNTER — Ambulatory Visit (INDEPENDENT_AMBULATORY_CARE_PROVIDER_SITE_OTHER): Payer: BC Managed Care – PPO | Admitting: Family Medicine

## 2011-05-20 DIAGNOSIS — I82409 Acute embolism and thrombosis of unspecified deep veins of unspecified lower extremity: Secondary | ICD-10-CM

## 2011-05-20 DIAGNOSIS — Z7902 Long term (current) use of antithrombotics/antiplatelets: Secondary | ICD-10-CM

## 2011-05-20 DIAGNOSIS — Z7901 Long term (current) use of anticoagulants: Secondary | ICD-10-CM

## 2011-05-20 LAB — POCT INR: INR: 2.9

## 2011-05-20 NOTE — Patient Instructions (Signed)
Continue 5 mg daily, except 7.5 mg every Tue, Thurs , recheck 4 weeks

## 2011-06-03 ENCOUNTER — Ambulatory Visit (INDEPENDENT_AMBULATORY_CARE_PROVIDER_SITE_OTHER): Payer: BC Managed Care – PPO | Admitting: *Deleted

## 2011-06-03 DIAGNOSIS — E538 Deficiency of other specified B group vitamins: Secondary | ICD-10-CM

## 2011-06-03 MED ORDER — CYANOCOBALAMIN 1000 MCG/ML IJ SOLN
1000.0000 ug | Freq: Once | INTRAMUSCULAR | Status: AC
  Administered 2011-06-03: 1000 ug via INTRAMUSCULAR

## 2011-06-11 ENCOUNTER — Other Ambulatory Visit: Payer: Self-pay | Admitting: *Deleted

## 2011-06-11 DIAGNOSIS — B20 Human immunodeficiency virus [HIV] disease: Secondary | ICD-10-CM

## 2011-06-11 MED ORDER — DARUNAVIR ETHANOLATE 800 MG PO TABS: 800.0000 mg | ORAL_TABLET | Freq: Every day | ORAL | Status: DC

## 2011-06-11 NOTE — Telephone Encounter (Signed)
Patient was in clinic today for labs and remembered that she received a letter from her insurance that informed her that Prezista 400 mg was being D/C and she would need a new Rx for Prezista 800 mg. Spoke with provider and was given the ok to give her a new Rx just had to make sure she knew that she will now have to take the medication once a day since going from 400 to 800. She voiced understanding. She needed a paper Rx as she used mail order, did try to advise her that we could e-scribe her med to her pharmacy even though it is a Marketing executive required through her insurance. She still wanted the paper Rx and I gave it to her.

## 2011-06-24 ENCOUNTER — Ambulatory Visit (INDEPENDENT_AMBULATORY_CARE_PROVIDER_SITE_OTHER): Payer: BC Managed Care – PPO | Admitting: Family Medicine

## 2011-06-24 ENCOUNTER — Encounter: Payer: Self-pay | Admitting: Family Medicine

## 2011-06-24 DIAGNOSIS — Z7902 Long term (current) use of antithrombotics/antiplatelets: Secondary | ICD-10-CM

## 2011-06-24 DIAGNOSIS — Z7901 Long term (current) use of anticoagulants: Secondary | ICD-10-CM

## 2011-06-24 DIAGNOSIS — I82409 Acute embolism and thrombosis of unspecified deep veins of unspecified lower extremity: Secondary | ICD-10-CM

## 2011-06-24 NOTE — Patient Instructions (Signed)
Take 10 mg today, 7.5 mg Wed, Thurs then 5 mg daily, Recheck in 1 week

## 2011-07-01 ENCOUNTER — Ambulatory Visit (INDEPENDENT_AMBULATORY_CARE_PROVIDER_SITE_OTHER): Payer: BC Managed Care – PPO | Admitting: Family Medicine

## 2011-07-01 ENCOUNTER — Ambulatory Visit (INDEPENDENT_AMBULATORY_CARE_PROVIDER_SITE_OTHER): Payer: BC Managed Care – PPO | Admitting: *Deleted

## 2011-07-01 DIAGNOSIS — Z7902 Long term (current) use of antithrombotics/antiplatelets: Secondary | ICD-10-CM

## 2011-07-01 DIAGNOSIS — I82409 Acute embolism and thrombosis of unspecified deep veins of unspecified lower extremity: Secondary | ICD-10-CM

## 2011-07-01 DIAGNOSIS — E538 Deficiency of other specified B group vitamins: Secondary | ICD-10-CM

## 2011-07-01 DIAGNOSIS — Z7901 Long term (current) use of anticoagulants: Secondary | ICD-10-CM

## 2011-07-01 LAB — POCT INR: INR: 1.9

## 2011-07-01 MED ORDER — CYANOCOBALAMIN 1000 MCG/ML IJ SOLN
1000.0000 ug | Freq: Once | INTRAMUSCULAR | Status: AC
  Administered 2011-07-01: 1000 ug via INTRAMUSCULAR

## 2011-07-01 NOTE — Patient Instructions (Signed)
7.5 mg daily, 5 mg TU/THUR, 2 weeks

## 2011-07-15 ENCOUNTER — Ambulatory Visit (INDEPENDENT_AMBULATORY_CARE_PROVIDER_SITE_OTHER): Payer: BC Managed Care – PPO | Admitting: Family Medicine

## 2011-07-15 DIAGNOSIS — I82409 Acute embolism and thrombosis of unspecified deep veins of unspecified lower extremity: Secondary | ICD-10-CM

## 2011-07-15 NOTE — Patient Instructions (Signed)
Hold today then 5 mg daily, 7.5 mg Tu,Thur. Sat, recheck 1 week

## 2011-07-22 ENCOUNTER — Ambulatory Visit (INDEPENDENT_AMBULATORY_CARE_PROVIDER_SITE_OTHER): Payer: BC Managed Care – PPO | Admitting: Family Medicine

## 2011-07-22 ENCOUNTER — Ambulatory Visit: Payer: BC Managed Care – PPO

## 2011-07-22 DIAGNOSIS — I82409 Acute embolism and thrombosis of unspecified deep veins of unspecified lower extremity: Secondary | ICD-10-CM

## 2011-07-22 DIAGNOSIS — Z7901 Long term (current) use of anticoagulants: Secondary | ICD-10-CM

## 2011-07-22 DIAGNOSIS — Z7902 Long term (current) use of antithrombotics/antiplatelets: Secondary | ICD-10-CM

## 2011-07-22 NOTE — Patient Instructions (Signed)
5 mg daily , 7.5 mg TH/Sat 2 weeks( reduced 2.5 mh weekly)

## 2011-07-26 ENCOUNTER — Other Ambulatory Visit: Payer: Self-pay | Admitting: Cardiovascular Disease

## 2011-07-26 ENCOUNTER — Observation Stay: Payer: Self-pay | Admitting: Internal Medicine

## 2011-07-26 DIAGNOSIS — R42 Dizziness and giddiness: Secondary | ICD-10-CM

## 2011-07-26 DIAGNOSIS — E876 Hypokalemia: Secondary | ICD-10-CM

## 2011-07-26 DIAGNOSIS — I498 Other specified cardiac arrhythmias: Secondary | ICD-10-CM

## 2011-07-26 LAB — PROTIME-INR
INR: 2
Prothrombin Time: 22.6 secs — ABNORMAL HIGH (ref 11.5–14.7)

## 2011-07-26 LAB — COMPREHENSIVE METABOLIC PANEL
Albumin: 3.4 g/dL (ref 3.4–5.0)
Anion Gap: 8 (ref 7–16)
Calcium, Total: 8.6 mg/dL (ref 8.5–10.1)
Glucose: 94 mg/dL (ref 65–99)
Osmolality: 274 (ref 275–301)
Potassium: 3 mmol/L — ABNORMAL LOW (ref 3.5–5.1)
SGPT (ALT): 21 U/L
Sodium: 137 mmol/L (ref 136–145)

## 2011-07-26 LAB — URINALYSIS, COMPLETE
Hyaline Cast: 3
Nitrite: POSITIVE
Ph: 5 (ref 4.5–8.0)
Protein: NEGATIVE
RBC,UR: 2 /HPF (ref 0–5)
Specific Gravity: 1.008 (ref 1.003–1.030)
Squamous Epithelial: NONE SEEN
WBC UR: 35 /HPF (ref 0–5)

## 2011-07-26 LAB — CK TOTAL AND CKMB (NOT AT ARMC)
CK, Total: 92 U/L (ref 21–215)
CK, Total: 81 U/L (ref 21–215)

## 2011-07-26 LAB — TROPONIN I
Troponin-I: <0.02 ng/mL
Troponin-I: <0.02 ng/mL

## 2011-07-26 LAB — CBC
HGB: 14.5 g/dL (ref 12.0–16.0)
MCH: 31 pg (ref 26.0–34.0)
MCHC: 33.6 g/dL (ref 32.0–36.0)
Platelet: 156 10*3/uL (ref 150–440)
WBC: 3.5 10*3/uL — ABNORMAL LOW (ref 3.6–11.0)

## 2011-07-27 LAB — URINE CULTURE

## 2011-07-29 ENCOUNTER — Ambulatory Visit: Payer: BC Managed Care – PPO

## 2011-07-30 ENCOUNTER — Ambulatory Visit (INDEPENDENT_AMBULATORY_CARE_PROVIDER_SITE_OTHER): Payer: BC Managed Care – PPO | Admitting: Family Medicine

## 2011-07-30 ENCOUNTER — Ambulatory Visit (INDEPENDENT_AMBULATORY_CARE_PROVIDER_SITE_OTHER): Payer: BC Managed Care – PPO | Admitting: *Deleted

## 2011-07-30 DIAGNOSIS — Z7901 Long term (current) use of anticoagulants: Secondary | ICD-10-CM

## 2011-07-30 DIAGNOSIS — E538 Deficiency of other specified B group vitamins: Secondary | ICD-10-CM

## 2011-07-30 DIAGNOSIS — E876 Hypokalemia: Secondary | ICD-10-CM

## 2011-07-30 DIAGNOSIS — I82409 Acute embolism and thrombosis of unspecified deep veins of unspecified lower extremity: Secondary | ICD-10-CM

## 2011-07-30 DIAGNOSIS — Z5181 Encounter for therapeutic drug level monitoring: Secondary | ICD-10-CM

## 2011-07-30 DIAGNOSIS — I498 Other specified cardiac arrhythmias: Secondary | ICD-10-CM

## 2011-07-30 DIAGNOSIS — Z7902 Long term (current) use of antithrombotics/antiplatelets: Secondary | ICD-10-CM

## 2011-07-30 LAB — BASIC METABOLIC PANEL
BUN: 17 mg/dL (ref 6–23)
CO2: 27 mEq/L (ref 19–32)
Calcium: 8.6 mg/dL (ref 8.4–10.5)
Creatinine, Ser: 0.8 mg/dL (ref 0.4–1.2)
GFR: 75.68 mL/min (ref >60.00)
Glucose, Bld: 84 mg/dL (ref 70–99)
Sodium: 139 mEq/L (ref 135–145)

## 2011-07-30 MED ORDER — CYANOCOBALAMIN 1000 MCG/ML IJ SOLN
1000.0000 ug | Freq: Once | INTRAMUSCULAR | Status: AC
  Administered 2011-07-30: 1000 ug via INTRAMUSCULAR

## 2011-07-30 NOTE — Patient Instructions (Signed)
5 mg daily , 7.5 mg TU/( reduce 2.5 mg )and check in 1 week after Dr appt

## 2011-08-06 ENCOUNTER — Ambulatory Visit (INDEPENDENT_AMBULATORY_CARE_PROVIDER_SITE_OTHER): Payer: BC Managed Care – PPO | Admitting: Family Medicine

## 2011-08-06 ENCOUNTER — Encounter: Payer: Self-pay | Admitting: Family Medicine

## 2011-08-06 VITALS — BP 118/64 | HR 54 | Temp 98.0°F | Wt 201.2 lb

## 2011-08-06 DIAGNOSIS — I82409 Acute embolism and thrombosis of unspecified deep veins of unspecified lower extremity: Secondary | ICD-10-CM

## 2011-08-06 DIAGNOSIS — R001 Bradycardia, unspecified: Secondary | ICD-10-CM | POA: Insufficient documentation

## 2011-08-06 DIAGNOSIS — Z7901 Long term (current) use of anticoagulants: Secondary | ICD-10-CM

## 2011-08-06 DIAGNOSIS — I10 Essential (primary) hypertension: Secondary | ICD-10-CM

## 2011-08-06 DIAGNOSIS — R42 Dizziness and giddiness: Secondary | ICD-10-CM | POA: Insufficient documentation

## 2011-08-06 DIAGNOSIS — I498 Other specified cardiac arrhythmias: Secondary | ICD-10-CM

## 2011-08-06 DIAGNOSIS — Z7902 Long term (current) use of antithrombotics/antiplatelets: Secondary | ICD-10-CM

## 2011-08-06 NOTE — Patient Instructions (Addendum)
5 mg daily , 7.5 mg TU recheck check in 2 week

## 2011-08-06 NOTE — Assessment & Plan Note (Signed)
Anticipate due to bradycardia or possibly hypokalemia as dizziness resolved with replacement. Currently bradycardic but asymptomatic. Continue to monitor.  Has f/u tomorrow with cards to review holter monitor results. Stay off HCTZ for now, bp range good.

## 2011-08-06 NOTE — Assessment & Plan Note (Signed)
Was on 50mg  HCTZ, this has been stopped and she continues to have good blood pressure readings. Ok to stay off HCTZ, advised to keep close eye on bp range at home with cuff and to update me if bp rising.

## 2011-08-06 NOTE — Assessment & Plan Note (Signed)
INR today WNL = 2.5

## 2011-08-06 NOTE — Progress Notes (Signed)
Subjective:    Patient ID: [name], female    DOB: 04-08-1945, 66 y.o.   MRN: [id number]  HPI CC: hosp f/u  Pleasant 66 yo with h/o HIV, DVT on chronic coumadin, HTN and B12 def presents for hosp f/u after [facility] admission 07/26/2011 (24 hr obs) for dizziness thought 2/2 symptomatic bradycardia (to 40s) and hypokalemia (K 3.0).  Had eval by Dr. Mariah Milling with 24 hour monitor which showed long stretches of bradycardia with daytime average 40-50s.  Pending f/u with cards (appt tomorrow).  Tooth filled a few days prior to procedure, then when awoke, felt really dizzy and nauseated, trouble sitting upright.  "Wasn't like vertigo" though.  Dizziness lasted about 16 hours.  After potassium given, dizziness resolved.    HCTZ was held 2/2 concern for hypokalemia.  rec check BMP to recheck hypokalemia. Potassium last week was 3.8, stable.  Off HCTZ. BP Readings from Last 3 Encounters:  08/06/11 118/64  04/29/11 133/74  01/23/11 136/82  bp actually ok today.  Has bp cuff but hasn't been checking.  No HA, vision changes, CP/tightness, SOB, leg swelling.   Records reviewed: Head CT WNL CXR WNL EKG - prolonged PR and sinus bradycardia. Cr 0.83, CPK 92, cardiac enzymes neg x2.  WBC 3.5, K 3.0.  Wt Readings from Last 3 Encounters:  08/06/11 201 lb 4 oz (91.286 kg)  04/29/11 203 lb 1.9 oz (92.135 kg)  01/23/11 213 lb (96.616 kg)  recently undergoing diet and has noted weight loss, notes husband has lost more however, on same diet.  Requests husband's cialis dose to be increased  Medications and allergies reviewed and updated in chart.  Past histories reviewed and updated if relevant as below. Patient Active Problem List  Diagnosis  . HIV DISEASE  . ANXIETY STATE, UNSPECIFIED  . HYPERTENSION  . DYSPLASIA, CERVIX NOS  . HX, PERSONAL, PAST NONCOMPLIANCE  . POSTPROCEDURAL STATUS NEC  . DEEP VENOUS THROMBOPHLEBITIS, LEG, RIGHT  . Anemia  . Iron deficiency  . B12 deficiency   Past Medical  History  Diagnosis Date  . 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 Cyndie Chime)  . HIV (human immunodeficiency virus infection) 2003    Dr. Ninetta Lights  . Superficial thrombophlebitis     recurrent, on voltaren gel  . Gastric bypass status for obesity     h/o morbid obesity  . HTN (hypertension)   . Asthma     no problems in last few years  . Cervical pain (neck)     h/o bulging disk   Past Surgical History  Procedure Date  . Gastric bypass 06/17/00  . Hernia repair 2004    abd s/p repair/tummy tuck  . Tubal ligation 1977  . Cholecystectomy 1996  . Tonsillectomy 1965  . Laser thermal ablation 04/2010    distal left greater saphenous vein   History  Substance Use Topics  . Smoking status: Never Smoker   . Smokeless tobacco: Never Used  . Alcohol Use: Yes   Family History  Problem Relation Age of Onset  . Bipolar disorder Daughter   . Schizophrenia Daughter   . Cancer Mother     uterine, lung cancer (smoker)  . Coronary artery disease Neg Hx   . Stroke Neg Hx   . Diabetes Neg Hx   . Cancer Maternal Aunt     breast cancer  . Cancer Maternal Uncle  bone cancer   Allergies  Allergen Reactions  . Penicillins     REACTION: edema  . Sulfa Antibiotics Other (See Comments)    Chest pain   Current Outpatient Prescriptions on File Prior to Visit  Medication Sig Dispense Refill  . albuterol (PROVENTIL,VENTOLIN) 90 MCG/ACT inhaler Inhale 2 puffs into the lungs every 6 (six) hours as needed for wheezing.  17 g  12  . cyanocobalamin (,VITAMIN B-12,) 1000 MCG/ML injection Inject 1,000 mcg into the muscle as directed. Weekly for 4 weeks and then monthly for 1 year.       . Darunavir Ethanolate (PREZISTA) 800 MG tablet Take 1 tablet (800 mg total) by mouth daily with breakfast.  90 tablet  3  . diclofenac sodium (VOLTAREN) 1 % GEL Apply 1 application topically 4 (four) times  daily.  100 g  3  . Emtricitab-Rilpivir-Tenofovir 200-25-300 MG TABS Take 1 tablet by mouth daily.  90 tablet  3  . ferrous sulfate 325 (65 FE) MG tablet TAKE 1 TABLET BY MOUTH EVERY DAY  30 tablet  11  . hydrochlorothiazide 50 MG tablet Take 1 tablet (50 mg total) by mouth daily.  90 tablet  3  . ritonavir (NORVIR) 100 MG TABS Take 1 tablet (100 mg total) by mouth daily.  90 tablet  3  . warfarin (COUMADIN) 5 MG tablet Take 5 mg by mouth daily.           Review of Systems Per HPI    Objective:   Physical Exam  Constitutional: She appears well-developed and well-nourished. No distress.  HENT:  Head: Normocephalic and atraumatic.  Mouth/Throat: Oropharynx is clear and moist. No oropharyngeal exudate.  Eyes: Conjunctivae and EOM are normal. Pupils are equal, round, and reactive to light. No scleral icterus.  Neck: Normal range of motion. Neck supple. Carotid bruit is not present.  Cardiovascular: Normal rate, regular rhythm, normal heart sounds and intact distal pulses.   No murmur heard. Pulmonary/Chest: Effort normal and breath sounds normal. No respiratory distress. She has no wheezes. She has no rales.  Musculoskeletal: She exhibits no edema.  Lymphadenopathy:    She has no cervical adenopathy.  Skin: Skin is warm and dry. No rash noted.      Assessment & Plan:

## 2011-08-06 NOTE — Patient Instructions (Signed)
Good to see you today, call us with questions. Hold hydrochlorothiazide as your blood pressure is looking great today. Keep an eye on blood pressure for next few weeks - maybe once or twice a week.  If consistently >140/90, let me know.

## 2011-08-07 ENCOUNTER — Other Ambulatory Visit: Payer: Self-pay

## 2011-08-07 ENCOUNTER — Encounter: Payer: Self-pay | Admitting: Cardiovascular Disease

## 2011-08-07 ENCOUNTER — Ambulatory Visit (INDEPENDENT_AMBULATORY_CARE_PROVIDER_SITE_OTHER): Payer: BC Managed Care – PPO | Admitting: Cardiovascular Disease

## 2011-08-07 VITALS — BP 102/78 | HR 51 | Ht 63.5 in | Wt 200.0 lb

## 2011-08-07 DIAGNOSIS — I498 Other specified cardiac arrhythmias: Secondary | ICD-10-CM

## 2011-08-07 DIAGNOSIS — R001 Bradycardia, unspecified: Secondary | ICD-10-CM

## 2011-08-07 DIAGNOSIS — I10 Essential (primary) hypertension: Secondary | ICD-10-CM

## 2011-08-07 MED ORDER — WARFARIN SODIUM 5 MG PO TABS: 5.0000 mg | ORAL_TABLET | Freq: Every day | ORAL | Status: DC

## 2011-08-07 NOTE — Patient Instructions (Addendum)
Your physician has requested that you have an echocardiogram. Echocardiography is a painless test that uses sound waves to create images of your heart. It provides your doctor with information about the size and shape of your heart and how well your heart's chambers and valves are working. This procedure takes approximately one hour. There are no restrictions for this procedure.  Follow up with Dr. Mariah Milling in 3 months.

## 2011-08-07 NOTE — Assessment & Plan Note (Signed)
Her blood pressure is actually on the low side today. Would keep her off blood pressure medications for now.

## 2011-08-07 NOTE — Telephone Encounter (Signed)
Sent in.plz notify pt.  

## 2011-08-07 NOTE — Progress Notes (Signed)
HPI  This is a 66 year old female who is here today for a followup visit after recent hospitalization. She has past medical history of HIV infection, hypertension, right lower extremity DVT as well as asthma. She presented on June 8 with dizziness with mild nausea but no syncope or presyncope. On presentation she was noted to be bradycardic with a heart rate in the 40s. She was observed overnight. She was noted to be hypokalemic. Potassium was 3. She was on hydrochlorothiazide for hypertension which was discontinued. Since hospital discharge, the patient has not had any further dizziness. She denies any chest pain, dyspnea or palpitations. On telemetry, she was noted to have bradycardia with a heart rate between 40-50 with pauses up to 2.5 second. She had a Holter monitor done after discharge which showed similar findings. her EKG while hospitalized showed sinus bradycardia with a heart rate of 46 beats per minute with a PR interval her 200 ms. The rest of the labs were unremarkable.  Allergies  Allergen Reactions  . Penicillins     REACTION: edema  . Sulfa Antibiotics Other (See Comments)    Chest pain     Current Outpatient Prescriptions on File Prior to Visit  Medication Sig Dispense Refill  . albuterol (PROVENTIL,VENTOLIN) 90 MCG/ACT inhaler Inhale 2 puffs into the lungs every 6 (six) hours as needed for wheezing.  17 g  12  . cyanocobalamin (,VITAMIN B-12,) 1000 MCG/ML injection Inject 1,000 mcg into the muscle as directed. Weekly for 4 weeks and then monthly for 1 year.       . Darunavir Ethanolate (PREZISTA) 800 MG tablet Take 1 tablet (800 mg total) by mouth daily with breakfast.  90 tablet  3  . diclofenac sodium (VOLTAREN) 1 % GEL Apply 1 application topically 4 (four) times daily.  100 g  3  . Emtricitab-Rilpivir-Tenofovir 200-25-300 MG TABS Take 1 tablet by mouth daily.  90 tablet  3  . ferrous sulfate 325 (65 FE) MG tablet TAKE 1 TABLET BY MOUTH EVERY DAY  30 tablet  11  .  ritonavir (NORVIR) 100 MG TABS Take 1 tablet (100 mg total) by mouth daily.  90 tablet  3  . DISCONTD: warfarin (COUMADIN) 5 MG tablet Take 5 mg by mouth daily.        . hydrochlorothiazide 50 MG tablet Take 1 tablet (50 mg total) by mouth daily.  90 tablet  3     Past Medical History  Diagnosis Date  . 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 Cyndie Chime)  . HIV (human immunodeficiency virus infection) 2003    Dr. Ninetta Lights  . Superficial thrombophlebitis     recurrent, on voltaren gel  . Gastric bypass status for obesity     h/o morbid obesity  . HTN (hypertension)   . Asthma     no problems in last few years  . Cervical pain (neck)     h/o bulging disk     Past Surgical History  Procedure Date  . Gastric bypass 06/17/00  . Hernia repair 2004    abd s/p repair/tummy tuck  . Tubal ligation 1977  . Cholecystectomy 1996  . Tonsillectomy 1965  . Laser thermal ablation 04/2010    distal left greater saphenous vein     Family History  Problem Relation Age of Onset  . Bipolar disorder Daughter   . Schizophrenia Daughter   .  Cancer Mother     uterine, lung cancer (smoker)  . Coronary artery disease Neg Hx   . Stroke Neg Hx   . Diabetes Neg Hx   . Cancer Maternal Aunt     breast cancer  . Cancer Maternal Uncle     bone cancer     History   Social History  . Marital Status: Married    Spouse Name: N/A    Number of Children: 5  . Years of Education: 89yrs colle   Occupational History  . customer service rep Other    [employer]   Social History Main Topics  . Smoking status: Never Smoker   . Smokeless tobacco: Never Used  . Alcohol Use: Yes  . Drug Use: No  . Sexually Active: Yes     refused condoms   Other Topics Concern  . Not on file   Social History Narrative   Caffeine: 1 cup coffee and large cokeLives with husband, 1 dogMarried; husband HIV +, 5  children (3 personal, 2 adopted)Occupation: [employer] data processor serviceActivity: occasional swimming, exercising at fitness center, walking someDiet: water, lots of fruits and vegetables     PHYSICAL EXAM   BP 102/78  Pulse 51  Ht 5' 3.5" (1.613 m)  Wt 200 lb (90.719 kg)  BMI 34.87 kg/m2 Constitutional: She is oriented to person, place, and time. She appears well-developed and well-nourished. No distress.  HENT: No nasal discharge.  Head: Normocephalic and atraumatic.  Eyes: Pupils are equal and round. Right eye exhibits no discharge. Left eye exhibits no discharge.  Neck: Normal range of motion. Neck supple. No JVD present. No thyromegaly present.  Cardiovascular: Normal rate, regular rhythm, normal heart sounds. Exam reveals no gallop and no friction rub. No murmur heard.  Pulmonary/Chest: Effort normal and breath sounds normal. No stridor. No respiratory distress. She has no wheezes. She has no rales. She exhibits no tenderness.  Abdominal: Soft. Bowel sounds are normal. She exhibits no distension. There is no tenderness. There is no rebound and no guarding.  Musculoskeletal: Normal range of motion. She exhibits no edema and no tenderness.  Neurological: She is alert and oriented to person, place, and time. Coordination normal.  Skin: Skin is warm and dry. No rash noted. She is not diaphoretic. No erythema. No pallor.  Psychiatric: She has a normal mood and affect. Her behavior is normal. Judgment and thought content normal.     EKG: Sinus  Bradycardia  -With rate variation  cv = 11. PR interval: 160 ms WITHIN NORMAL LIMITS   ASSESSMENT AND PLAN

## 2011-08-07 NOTE — Telephone Encounter (Signed)
Pt request [facility]ill [facility] Is OK to refill, cannot find where Dr Reece Agar has prescribed?Please advise.

## 2011-08-07 NOTE — Assessment & Plan Note (Signed)
The patient has mild bradycardia as well as pauses that are still not significant or symptomatic enough to warrant a permanent pacemaker placement. Symptomatically, actually she seems to be feeling much better after stopping hydrochlorothiazide. I suspect that her dizziness was multifactorial due to bradycardia in the setting of being on a diuretic with significant hypokalemia. I don't see in the chart that her thyroid function was checked. Thus, I recommend checking TSH. Among her medications, only Norvir is known to cause PR prolongation and AV block. PR interval was at the upper limit of normal during her hospitalization but is normal today. This medication also can cause dizziness. I asked her to discuss this with her  infectious diseases specialist. Due to her symptoms, I recommend an echocardiogram. I asked her to monitor her symptoms. We'll have her followup in few months.

## 2011-08-07 NOTE — Telephone Encounter (Signed)
Left message advising patient that Rx was filled.

## 2011-08-13 ENCOUNTER — Telehealth: Payer: Self-pay | Admitting: Oncology

## 2011-08-13 NOTE — Telephone Encounter (Signed)
S/w pt re appt for 7/30 °

## 2011-08-19 ENCOUNTER — Other Ambulatory Visit (INDEPENDENT_AMBULATORY_CARE_PROVIDER_SITE_OTHER): Payer: BC Managed Care – PPO

## 2011-08-19 ENCOUNTER — Other Ambulatory Visit: Payer: Self-pay

## 2011-08-19 ENCOUNTER — Ambulatory Visit: Payer: BC Managed Care – PPO

## 2011-08-19 ENCOUNTER — Ambulatory Visit (INDEPENDENT_AMBULATORY_CARE_PROVIDER_SITE_OTHER): Payer: BC Managed Care – PPO | Admitting: Family Medicine

## 2011-08-19 DIAGNOSIS — I10 Essential (primary) hypertension: Secondary | ICD-10-CM

## 2011-08-19 DIAGNOSIS — I82409 Acute embolism and thrombosis of unspecified deep veins of unspecified lower extremity: Secondary | ICD-10-CM

## 2011-08-19 DIAGNOSIS — Z7902 Long term (current) use of antithrombotics/antiplatelets: Secondary | ICD-10-CM

## 2011-08-19 DIAGNOSIS — Z7901 Long term (current) use of anticoagulants: Secondary | ICD-10-CM

## 2011-08-19 DIAGNOSIS — I498 Other specified cardiac arrhythmias: Secondary | ICD-10-CM

## 2011-08-19 DIAGNOSIS — R001 Bradycardia, unspecified: Secondary | ICD-10-CM

## 2011-08-19 NOTE — Patient Instructions (Addendum)
5 mg daily , 7.5 mg TU recheck check in 4 week

## 2011-08-26 ENCOUNTER — Other Ambulatory Visit: Payer: BC Managed Care – PPO

## 2011-08-26 DIAGNOSIS — B20 Human immunodeficiency virus [HIV] disease: Secondary | ICD-10-CM

## 2011-08-26 LAB — CBC
HCT: 41.8 % (ref 36.0–46.0)
MCHC: 33.3 g/dL (ref 30.0–36.0)
Platelets: 188 10*3/uL (ref 150–400)
RDW: 14 % (ref 11.5–15.5)

## 2011-08-27 LAB — COMPLETE METABOLIC PANEL WITH GFR
ALT: 17 U/L (ref 0–35)
BUN: 14 mg/dL (ref 6–23)
CO2: 28 mEq/L (ref 19–32)
Calcium: 9 mg/dL (ref 8.4–10.5)
Chloride: 107 mEq/L (ref 96–112)
Creat: 0.78 mg/dL (ref 0.50–1.10)
GFR, Est African American: >89 mL/min
GFR, Est Non African American: 81 mL/min
Glucose, Bld: 85 mg/dL (ref 70–99)

## 2011-08-28 LAB — HIV-1 RNA QUANT-NO REFLEX-BLD
HIV 1 RNA Quant: 853 copies/mL — ABNORMAL HIGH (ref <20)
HIV-1 RNA Quant, Log: 2.93 {Log} — ABNORMAL HIGH (ref <1.30)

## 2011-09-04 ENCOUNTER — Encounter: Payer: Self-pay | Admitting: *Deleted

## 2011-09-10 ENCOUNTER — Ambulatory Visit (INDEPENDENT_AMBULATORY_CARE_PROVIDER_SITE_OTHER): Payer: BC Managed Care – PPO | Admitting: Infectious Diseases

## 2011-09-10 ENCOUNTER — Encounter: Payer: Self-pay | Admitting: Infectious Diseases

## 2011-09-10 VITALS — BP 139/81 | HR 52 | Temp 98.0°F | Ht 63.5 in | Wt 201.5 lb

## 2011-09-10 DIAGNOSIS — I10 Essential (primary) hypertension: Secondary | ICD-10-CM

## 2011-09-10 DIAGNOSIS — R42 Dizziness and giddiness: Secondary | ICD-10-CM

## 2011-09-10 DIAGNOSIS — N879 Dysplasia of cervix uteri, unspecified: Secondary | ICD-10-CM

## 2011-09-10 DIAGNOSIS — B20 Human immunodeficiency virus [HIV] disease: Secondary | ICD-10-CM

## 2011-09-10 NOTE — Assessment & Plan Note (Signed)
Her Vl is detectable. Will check her HIV RNA and genotype. Her CD4 is stable. Will plan to see her back in 4-6 months provided that her VL is better. Wt as been going down.

## 2011-09-10 NOTE — Progress Notes (Signed)
  Subjective:    Patient ID: [name], female    DOB: October 04, 1945, 66 y.o.   MRN: [id number]  HPI 66 yo F with HTN, cervical dysplasia, DVT (~3 years ago) and HIV+. 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  Adm 6-8 with dizziness, hypokalemia (attributed to HCTZ) and bradycardia. She was adm for ~ 24h, did not need pacer. Concerned that her Iron she was taking recalled due to being tainted with HCTZ (?). She has quit taking this. Her cardiologist has some concern about her taking norvir and risk of PR prolongation.   Taking B12 monthly, can't tell difference.  HIV 1 RNA Quant (copies/mL)  Date Value  08/26/2011 853*  04/15/2011 50*  01/02/2011 41600*     CD4 T Cell Abs (cmm)  Date Value  08/26/2011 260*  04/15/2011 250*  01/02/2011 210*      Review of Systems PAP sched for 10-24-11. Has abd pain if she falls asleep on her R side.     Objective:   Physical Exam  Constitutional: She appears well-developed and well-nourished.  HENT:  Mouth/Throat: No oropharyngeal exudate.  Eyes: EOM are normal. Pupils are equal, round, and reactive to light.  Neck: Neck supple.  Cardiovascular: Normal rate, regular rhythm and normal heart sounds.   Pulmonary/Chest: Effort normal and breath sounds normal.  Abdominal: Soft. Bowel sounds are normal. There is no tenderness.  Lymphadenopathy:    She has no cervical adenopathy.          Assessment & Plan:

## 2011-09-10 NOTE — Assessment & Plan Note (Signed)
Resolved. Due to hypoK? Will defer stopping RTV at this point.

## 2011-09-10 NOTE — Assessment & Plan Note (Signed)
asx off meds.

## 2011-09-10 NOTE — Assessment & Plan Note (Signed)
To have repeat PAP 9-13.

## 2011-09-16 ENCOUNTER — Other Ambulatory Visit (HOSPITAL_BASED_OUTPATIENT_CLINIC_OR_DEPARTMENT_OTHER): Payer: BC Managed Care – PPO | Admitting: Lab

## 2011-09-16 ENCOUNTER — Ambulatory Visit (HOSPITAL_BASED_OUTPATIENT_CLINIC_OR_DEPARTMENT_OTHER): Payer: BC Managed Care – PPO | Admitting: Oncology

## 2011-09-16 ENCOUNTER — Ambulatory Visit (INDEPENDENT_AMBULATORY_CARE_PROVIDER_SITE_OTHER): Payer: BC Managed Care – PPO | Admitting: Family Medicine

## 2011-09-16 ENCOUNTER — Telehealth: Payer: Self-pay | Admitting: Oncology

## 2011-09-16 VITALS — BP 139/77 | HR 62 | Temp 97.1°F | Ht 63.5 in | Wt 201.8 lb

## 2011-09-16 DIAGNOSIS — Z9884 Bariatric surgery status: Secondary | ICD-10-CM

## 2011-09-16 DIAGNOSIS — Z8679 Personal history of other diseases of the circulatory system: Secondary | ICD-10-CM

## 2011-09-16 DIAGNOSIS — B20 Human immunodeficiency virus [HIV] disease: Secondary | ICD-10-CM

## 2011-09-16 DIAGNOSIS — Z7902 Long term (current) use of antithrombotics/antiplatelets: Secondary | ICD-10-CM

## 2011-09-16 DIAGNOSIS — I803 Phlebitis and thrombophlebitis of lower extremities, unspecified: Secondary | ICD-10-CM

## 2011-09-16 DIAGNOSIS — D508 Other iron deficiency anemias: Secondary | ICD-10-CM

## 2011-09-16 DIAGNOSIS — I82409 Acute embolism and thrombosis of unspecified deep veins of unspecified lower extremity: Secondary | ICD-10-CM

## 2011-09-16 DIAGNOSIS — D689 Coagulation defect, unspecified: Secondary | ICD-10-CM

## 2011-09-16 DIAGNOSIS — K912 Postsurgical malabsorption, not elsewhere classified: Secondary | ICD-10-CM

## 2011-09-16 DIAGNOSIS — D511 Vitamin B12 deficiency anemia due to selective vitamin B12 malabsorption with proteinuria: Secondary | ICD-10-CM

## 2011-09-16 DIAGNOSIS — Z7901 Long term (current) use of anticoagulants: Secondary | ICD-10-CM

## 2011-09-16 LAB — COMPREHENSIVE METABOLIC PANEL
AST: 24 U/L (ref 0–37)
BUN: 14 mg/dL (ref 6–23)
Calcium: 9.3 mg/dL (ref 8.4–10.5)
Chloride: 103 mEq/L (ref 96–112)
Creatinine, Ser: 0.94 mg/dL (ref 0.50–1.10)
Total Bilirubin: 0.4 mg/dL (ref 0.3–1.2)

## 2011-09-16 LAB — CBC WITH DIFFERENTIAL/PLATELET
Basophils Absolute: 0 10*3/uL (ref 0.0–0.1)
HCT: 42.8 % (ref 34.8–46.6)
HGB: 14.5 g/dL (ref 11.6–15.9)
MONO#: 0.2 10*3/uL (ref 0.1–0.9)
NEUT#: 2.2 10*3/uL (ref 1.5–6.5)
NEUT%: 64 % (ref 38.4–76.8)
WBC: 3.4 10*3/uL — ABNORMAL LOW (ref 3.9–10.3)
lymph#: 0.9 10*3/uL (ref 0.9–3.3)

## 2011-09-16 LAB — PROTIME-INR

## 2011-09-16 LAB — POCT INR: INR: 4

## 2011-09-16 NOTE — Progress Notes (Signed)
Took 2 double doses this week (accidental)

## 2011-09-16 NOTE — Telephone Encounter (Signed)
Called pt and left message for appt on July 2014 lab and MD

## 2011-09-16 NOTE — Patient Instructions (Signed)
Hold x 1 day then resume 5 mg daily , 7.5 mg TU check 1 week

## 2011-09-16 NOTE — Progress Notes (Signed)
CC:   Eustaquio Boyden, MD Lacretia Leigh. Ninetta Lights, M.D. Florina Ou, MD  Followup visit for this pleasant 66 year old woman I initially evaluated in January 2012, for episodes of recurrent superficial thrombophlebitis, and history of deep venous thrombosis.  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  I advised long-term Coumadin anticoagulation.  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  She has had no episodes of recurrent thromboses on full-dose Coumadin. INR checked yesterday was running a little high at 4.0 and her dose was adjusted slightly.  She was taking 5 mg 6 days of the week and 7.5 mg 1 day of the week and will now just take 5 mg 6 days of the week.  She was able to get established with Dr. Sharen Hones at [facility] who will manage her long-term anticoagulation.  She tells me about 1 month ago, she developed an attack of severe vertigo and was evaluated at [facility] in [place]. She was told that her pulse rate was down in the 30s and that her potassium level was very low.  Echocardiogram and CT scan of the brain reported to her as normal.  She was discharged on a Holter monitor and was told that she had no significant arrhythmias.  Shortly after that, she received an alert from her pharmacy that a lot of ferrous sulfate was apparently contaminated with another medication and was causing problems with vertigo and low potassium.  She has not had any subsequent episodes since stopping this preparation.  Of note, on the iron, her hemoglobin has come up from 11.6 back in July 2012 to 14.5 today.  She runs a low normal white count but this has been stable over time with current value 3400 with 64% neutrophils, 25%  lymphocytes, and platelet count 168,000.  She did ask [place] to repeat a pro time today to see if we would give a similar value that she had yesterday through Dr. Nicanor Alcon office. INR in our lab was 3.9 compared with 4.0 yesterday.  PHYSICAL EXAMINATION:  Blood pressure is 139/77, pulse 62 and regular. Weight is 202 pounds, stable compared with last year.  Head and neck normal.  Lungs:  Clear and resonant to percussion.  Regular cardiac rhythm.  No murmur.  Abdomen:  Soft, nontender, no mass or organomegaly. Extremities:  No edema.  No calf tenderness.  No superficial venous distention or cords.  Neurologic is grossly normal.  IMPRESSION: 1. 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 2. 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  No subsequent events on full-dose Coumadin anticoagulation. 3. 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  Controlled on current     medication. 4. 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  She tells me she would     like to switch to the sublingual B12 instead of getting monthly     injections.  I see no problem with this.  She may need to go on     another iron supplement.  However, at present, hemoglobin is     normal.  I told I will see her again in 1 year unless she  needs surgery and then I would be happy to work with her surgeon with any bridging of her anticoagulants that were the necessary perioperatively.    ______________________________ Levert Feinstein, M.D., F.A.C.P. JMG/MEDQ  D:  09/16/2011  T:  09/16/2011  Job:  [id number]

## 2011-09-23 ENCOUNTER — Ambulatory Visit (INDEPENDENT_AMBULATORY_CARE_PROVIDER_SITE_OTHER): Payer: BC Managed Care – PPO | Admitting: Family Medicine

## 2011-09-23 DIAGNOSIS — I82409 Acute embolism and thrombosis of unspecified deep veins of unspecified lower extremity: Secondary | ICD-10-CM

## 2011-09-23 DIAGNOSIS — Z7902 Long term (current) use of antithrombotics/antiplatelets: Secondary | ICD-10-CM

## 2011-09-23 DIAGNOSIS — Z7901 Long term (current) use of anticoagulants: Secondary | ICD-10-CM

## 2011-09-23 LAB — POCT INR: INR: 2.3

## 2011-09-23 NOTE — Patient Instructions (Signed)
5 mg daily , 7.5 mg TU check to 2 weeks

## 2011-10-07 ENCOUNTER — Ambulatory Visit (INDEPENDENT_AMBULATORY_CARE_PROVIDER_SITE_OTHER): Payer: BC Managed Care – PPO | Admitting: Family Medicine

## 2011-10-07 DIAGNOSIS — Z7901 Long term (current) use of anticoagulants: Secondary | ICD-10-CM

## 2011-10-07 DIAGNOSIS — I82409 Acute embolism and thrombosis of unspecified deep veins of unspecified lower extremity: Secondary | ICD-10-CM

## 2011-10-07 DIAGNOSIS — Z7902 Long term (current) use of antithrombotics/antiplatelets: Secondary | ICD-10-CM

## 2011-10-07 NOTE — Patient Instructions (Addendum)
Hold 2 doses then  5 mg daily , 7.5 mg TU check to 1 weeks

## 2011-10-07 NOTE — Progress Notes (Signed)
Recently on clindamycin (although shouldn't significantly affect INR).

## 2011-10-14 ENCOUNTER — Ambulatory Visit (INDEPENDENT_AMBULATORY_CARE_PROVIDER_SITE_OTHER): Payer: BC Managed Care – PPO | Admitting: Family Medicine

## 2011-10-14 ENCOUNTER — Other Ambulatory Visit: Payer: Self-pay | Admitting: Obstetrics and Gynecology

## 2011-10-14 ENCOUNTER — Other Ambulatory Visit (HOSPITAL_COMMUNITY)
Admission: RE | Admit: 2011-10-14 | Discharge: 2011-10-14 | Disposition: A | Discharge status: Home / Self Care | Payer: BC Managed Care – PPO | Source: Ambulatory Visit | Attending: Obstetrics and Gynecology | Admitting: Obstetrics and Gynecology

## 2011-10-14 DIAGNOSIS — Z7901 Long term (current) use of anticoagulants: Secondary | ICD-10-CM

## 2011-10-14 DIAGNOSIS — Z7902 Long term (current) use of antithrombotics/antiplatelets: Secondary | ICD-10-CM

## 2011-10-14 DIAGNOSIS — R87810 Cervical high risk human papillomavirus (HPV) DNA test positive: Secondary | ICD-10-CM | POA: Insufficient documentation

## 2011-10-14 DIAGNOSIS — Z1151 Encounter for screening for human papillomavirus (HPV): Secondary | ICD-10-CM | POA: Insufficient documentation

## 2011-10-14 DIAGNOSIS — I82409 Acute embolism and thrombosis of unspecified deep veins of unspecified lower extremity: Secondary | ICD-10-CM

## 2011-10-14 DIAGNOSIS — Z01419 Encounter for gynecological examination (general) (routine) without abnormal findings: Secondary | ICD-10-CM | POA: Insufficient documentation

## 2011-10-14 LAB — POCT INR: INR: 2.6

## 2011-10-14 NOTE — Patient Instructions (Signed)
Decrease to 5 mg daily, recheck 2 weeks

## 2011-10-28 ENCOUNTER — Ambulatory Visit (INDEPENDENT_AMBULATORY_CARE_PROVIDER_SITE_OTHER): Payer: BC Managed Care – PPO | Admitting: Family Medicine

## 2011-10-28 DIAGNOSIS — Z7901 Long term (current) use of anticoagulants: Secondary | ICD-10-CM

## 2011-10-28 DIAGNOSIS — I82409 Acute embolism and thrombosis of unspecified deep veins of unspecified lower extremity: Secondary | ICD-10-CM

## 2011-10-28 DIAGNOSIS — Z7902 Long term (current) use of antithrombotics/antiplatelets: Secondary | ICD-10-CM

## 2011-10-28 LAB — POCT INR: INR: 4.4

## 2011-10-28 NOTE — Patient Instructions (Addendum)
Hold x 2 days ,then  reduce dose by 5 mg weekly, start  5 mg daily except T/TH take 2.5 mg. Check in 10 days

## 2011-11-07 ENCOUNTER — Ambulatory Visit (INDEPENDENT_AMBULATORY_CARE_PROVIDER_SITE_OTHER): Payer: BC Managed Care – PPO | Admitting: Family Medicine

## 2011-11-07 DIAGNOSIS — Z7901 Long term (current) use of anticoagulants: Secondary | ICD-10-CM

## 2011-11-07 DIAGNOSIS — I82409 Acute embolism and thrombosis of unspecified deep veins of unspecified lower extremity: Secondary | ICD-10-CM

## 2011-11-07 DIAGNOSIS — Z7902 Long term (current) use of antithrombotics/antiplatelets: Secondary | ICD-10-CM

## 2011-11-07 LAB — POCT INR: INR: 3

## 2011-11-07 NOTE — Patient Instructions (Signed)
(  Reduce by 2.5 mg weekly), start  5 mg daily except T/TH, Sat take 2.5 mg recheck 2 weeks

## 2011-11-11 ENCOUNTER — Encounter: Payer: Self-pay | Admitting: Cardiovascular Disease

## 2011-11-11 ENCOUNTER — Ambulatory Visit (INDEPENDENT_AMBULATORY_CARE_PROVIDER_SITE_OTHER): Payer: BC Managed Care – PPO | Admitting: Cardiovascular Disease

## 2011-11-11 VITALS — BP 124/78 | HR 51 | Ht 63.5 in | Wt 206.5 lb

## 2011-11-11 DIAGNOSIS — E785 Hyperlipidemia, unspecified: Secondary | ICD-10-CM | POA: Insufficient documentation

## 2011-11-11 DIAGNOSIS — I498 Other specified cardiac arrhythmias: Secondary | ICD-10-CM

## 2011-11-11 DIAGNOSIS — I10 Essential (primary) hypertension: Secondary | ICD-10-CM

## 2011-11-11 DIAGNOSIS — R001 Bradycardia, unspecified: Secondary | ICD-10-CM

## 2011-11-11 NOTE — Assessment & Plan Note (Signed)
History of HIV. This may place her at higher risk for underlying coronary and vascular disease. We have suggested we be more aggressive with her cholesterol. She could try over-the-counter red yeast rice with recheck in 6 months.

## 2011-11-11 NOTE — Assessment & Plan Note (Signed)
Asymptomatic bradycardia. No further workup at this time 

## 2011-11-11 NOTE — Patient Instructions (Addendum)
You are doing well. Try red yeast rice for cholesterol two a day  Please call us if you have new issues that need to be addressed before your next appt.  Your physician wants you to follow-up in: 12 months.  You will receive a reminder letter in the mail two months in advance. If you don't receive a letter, please call our office to schedule the follow-up appointment.

## 2011-11-11 NOTE — Assessment & Plan Note (Signed)
Blood pressure is well controlled on today's visit. No changes made to the medications. 

## 2011-11-11 NOTE — Progress Notes (Signed)
Patient ID: [name], female    DOB: Apr 27, 1945, 66 y.o.   MRN: [id number]  HPI Comments: This is a 66 year old female with a past medical history of HIV infection, hypertension, right lower extremity DVT as well as asthma.    on July 26 2011 she presented with dizziness with mild nausea but no syncope or presyncope.  she was noted to be bradycardic with a heart rate in the 40s. She was observed overnight. She was noted to be hypokalemic. Potassium was 3. She was on hydrochlorothiazide for hypertension which was discontinued.   Since discharge, the patient has not had any further dizziness.    Holter monitor done after discharge  showed bradycardia.  She reports that she feels well and has no symptoms. She attributes her previous episodes to problems with iron which was recalled.  EKG shows sinus bradycardia with rate 51 beats per minute with no significant ST or T wave changes   Outpatient Encounter Prescriptions as of 11/11/2011  Medication Sig Dispense Refill  . albuterol (PROVENTIL,VENTOLIN) 90 MCG/ACT inhaler Inhale 2 puffs into the lungs every 6 (six) hours as needed for wheezing.  17 g  12  . Darunavir Ethanolate (PREZISTA) 800 MG tablet Take 1 tablet (800 mg total) by mouth daily with breakfast.  90 tablet  3  . diclofenac sodium (VOLTAREN) 1 % GEL Apply 1 application topically 4 (four) times daily.  100 g  3  . Emtricitab-Rilpivir-Tenofovir 200-25-300 MG TABS Take 1 tablet by mouth daily.  90 tablet  3  . ritonavir (NORVIR) 100 MG TABS Take 1 tablet (100 mg total) by mouth daily.  90 tablet  3  . warfarin (COUMADIN) 5 MG tablet Take 5 mg by mouth daily. Hold today 09/16/11, then 5mg  daily except 7.5 mg on tues.         Review of Systems  Constitutional: Negative.   HENT: Negative.   Eyes: Negative.   Respiratory: Negative.   Cardiovascular: Negative.   Gastrointestinal: Negative.   Musculoskeletal: Negative.   Skin: Negative.   Neurological: Negative.   Hematological:  Negative.   Psychiatric/Behavioral: Negative.   All other systems reviewed and are negative.    BP 124/78  Pulse 51  Ht 5' 3.5" (1.613 m)  Wt 206 lb 8 oz (93.668 kg)  BMI 36.01 kg/m2  Physical Exam  Nursing note and vitals reviewed. Constitutional: She is oriented to person, place, and time. She appears well-developed and well-nourished.  HENT:  Head: Normocephalic.  Nose: Nose normal.  Mouth/Throat: Oropharynx is clear and moist.  Eyes: Conjunctivae normal are normal. Pupils are equal, round, and reactive to light.  Neck: Normal range of motion. Neck supple. No JVD present.  Cardiovascular: Normal rate, regular rhythm, S1 normal, S2 normal, normal heart sounds and intact distal pulses.  Exam reveals no gallop and no friction rub.   No murmur heard. Pulmonary/Chest: Effort normal and breath sounds normal. No respiratory distress. She has no wheezes. She has no rales. She exhibits no tenderness.  Abdominal: Soft. Bowel sounds are normal. She exhibits no distension. There is no tenderness.  Musculoskeletal: Normal range of motion. She exhibits no edema and no tenderness.  Lymphadenopathy:    She has no cervical adenopathy.  Neurological: She is alert and oriented to person, place, and time. Coordination normal.  Skin: Skin is warm and dry. No rash noted. No erythema.  Psychiatric: She has a normal mood and affect. Her behavior is normal. Judgment and thought content normal.  Assessment and Plan

## 2011-11-25 ENCOUNTER — Ambulatory Visit (INDEPENDENT_AMBULATORY_CARE_PROVIDER_SITE_OTHER): Payer: BC Managed Care – PPO | Admitting: Family Medicine

## 2011-11-25 DIAGNOSIS — Z7902 Long term (current) use of antithrombotics/antiplatelets: Secondary | ICD-10-CM

## 2011-11-25 DIAGNOSIS — Z7901 Long term (current) use of anticoagulants: Secondary | ICD-10-CM

## 2011-11-25 DIAGNOSIS — I82409 Acute embolism and thrombosis of unspecified deep veins of unspecified lower extremity: Secondary | ICD-10-CM

## 2011-11-25 LAB — POCT INR: INR: 1.9

## 2011-11-25 NOTE — Patient Instructions (Signed)
Continue current dose, check in 4 weeks  

## 2011-11-27 ENCOUNTER — Telehealth: Payer: Self-pay | Admitting: Family Medicine

## 2011-11-27 NOTE — Telephone Encounter (Addendum)
Received letter from Dr. 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 I would like to get input from Dr. Cyndie Chime who follows her for hypercoagulable state. Will route note to him.

## 2011-11-28 ENCOUNTER — Encounter: Payer: Self-pay | Admitting: Infectious Diseases

## 2011-12-03 NOTE — Telephone Encounter (Signed)
Received response from Dr. Cyndie Chime.  He recommends tight anticoagulation around surgery - he recommends holding coumadin for 3 days prior to surgery, and if possible scheduling procedure on a Tuesday so we can check INR on Monday in office.  Then resuming coumadin the night of surgery.  He also recommended bridging with lovenox 1.5mg /kg SQ daily starting 24 hours after surgery until coumadin is therapeutic.  I will let Dr. Al Corpus know of above.  Please call pt to touch base with her.  If surgery is scheduled on Tuesday, have her come in Monday for INR check and then we will prescribe lovenox after surgery (I anticipate needing only a few days of lovenox). Plz fax letter to Dr. Al Corpus (in Kim's box).

## 2011-12-05 NOTE — Telephone Encounter (Signed)
Spoke with patient and surgery is only done on Friday's. Any further suggestions? Possibly checking INR on the Thursday prior to surgery?

## 2011-12-05 NOTE — Telephone Encounter (Signed)
Patient notified. She will schedule surgery and then call for protime appt. She will check to see if she needs refill on lovenox and if so, give me a call. She knows to hold coumadin 3 days prior to surgery.

## 2011-12-05 NOTE — Telephone Encounter (Signed)
Message left for patient to return my call and letter faxed to Dr. Al Corpus.

## 2011-12-05 NOTE — Telephone Encounter (Signed)
Still recommend holding coumadin for 3 d prior to surgery so hold starting Tuesday of that week.  Come in on Thursday for INR check.

## 2011-12-17 ENCOUNTER — Telehealth: Payer: Self-pay

## 2011-12-17 DIAGNOSIS — I82409 Acute embolism and thrombosis of unspecified deep veins of unspecified lower extremity: Secondary | ICD-10-CM

## 2011-12-17 DIAGNOSIS — Z9889 Other specified postprocedural states: Secondary | ICD-10-CM

## 2011-12-17 NOTE — Telephone Encounter (Signed)
[name] with Dr Theo Dills at [facility]; did receive letter from Dr Sharen Hones for medical clearance; Dr Theo Dills only does surgery on Fridays. Pt scheduled for surgery on 01/02/12; pt to call Dr Sharen Hones to find out start and stop dates of medications. Refer back to letter Dr Sharen Hones sent to Dr Theo Dills re: start and stop date of med.Please advise.

## 2011-12-19 MED ORDER — ENOXAPARIN SODIUM 150 MG/ML ~~LOC~~ SOLN: 150.0000 mg | Freq: Every day | SUBCUTANEOUS | Status: DC

## 2011-12-19 NOTE — Telephone Encounter (Signed)
Patient notified and INR appt scheduled. She said she doesn't know if she will be able to come in the following Monday for a recheck due to pain. She has been told she can only go to the restroom and then she has to be back off of her foot. She lives in an apartment, so she's not sure if she will be able to get here. I told her the coumadin clinic is very flexible, so if she finds she can make it, just to call and we can get her in. Is it critical that she is checked that day or can she just stay on the lovenox until she can get in here for a recheck?

## 2011-12-19 NOTE — Telephone Encounter (Signed)
plz notify pt - I recommend holding coumadin starting Tuesday 12/30/2011 and having her come in on Thursday 01/01/2012 for INR check which will may fax to Dr. Geryl Rankins office. I also recommend starting lovenox at 1.5mg /kg SQ QD 24 hours after surgery - printed out script and placed in Kim's box to give Ms Lund on her INR check on Thursday 01/01/2012. She can return the next Monday for coumadin check again to see if we can stop lovenox. Last weight 93.6 kg Wt Readings from Last 3 Encounters:  11/11/11 206 lb 8 oz (93.668 kg)  09/16/11 201 lb 12.8 oz (91.536 kg)  09/10/11 201 lb 8 oz (91.4 kg)

## 2011-12-23 ENCOUNTER — Ambulatory Visit: Payer: BC Managed Care – PPO

## 2011-12-23 ENCOUNTER — Ambulatory Visit (INDEPENDENT_AMBULATORY_CARE_PROVIDER_SITE_OTHER): Payer: BC Managed Care – PPO | Admitting: Family Medicine

## 2011-12-23 DIAGNOSIS — Z7902 Long term (current) use of antithrombotics/antiplatelets: Secondary | ICD-10-CM

## 2011-12-23 DIAGNOSIS — Z7901 Long term (current) use of anticoagulants: Secondary | ICD-10-CM

## 2011-12-23 DIAGNOSIS — I82409 Acute embolism and thrombosis of unspecified deep veins of unspecified lower extremity: Secondary | ICD-10-CM

## 2011-12-23 NOTE — Patient Instructions (Signed)
Continue current dose, check in 4 weeks  

## 2011-12-23 NOTE — Telephone Encounter (Signed)
Patient notified today at protime appt. Rx given to patient. Will have to set up with [facility] per patient request for INR. I will notify [facility].

## 2011-12-23 NOTE — Telephone Encounter (Signed)
Needs to have coumadin checked either Mon or Tuesday after surgery to see if we can stop lovenox. If cannot come in here, may need to set her up with [facility] RN to check. have placed order in chart.

## 2011-12-23 NOTE — Telephone Encounter (Signed)
Order sent to [facility] to arrange [facility].

## 2011-12-24 ENCOUNTER — Telehealth: Payer: Self-pay | Admitting: Family Medicine

## 2011-12-24 NOTE — Telephone Encounter (Signed)
Great thanks. Will forward to [employer] as fyi.

## 2011-12-24 NOTE — Telephone Encounter (Signed)
FYI Dr Sharen Hones, I have contacted [facility] to go out to patients home for Protime check after her foot surgery. They have asked you for a Nursing evaluation in order to be able to do what you need them to do. I have asked them for a Start of care date of 01/05/2012. Contact persons name is Addison Bailey with [facility] and her direct phone number is [phone]. I have faxed your order to her and if there are any questions you or Selena Batten can call her directly with [place]. Their Fax# is [phone]. Tried to call the patient to also give her this information but had to leave a voicemail.

## 2011-12-25 NOTE — Telephone Encounter (Signed)
Unfortunately  [facility] called back to say that they could not staff for this referral and they could not accept it. I also heard back from Melissa from [facility] and they are not in the network for [facility] so they cant accept it either. I did let the patient know that we were not sure if it would be possible to get an agency involved to come out to draw her blood and that she should call her insurance co to find out directly from them who is in the network that we could call for their services. She was not happy about being told that she may have to drive over here to have Terri go out to the car to draw her blood.Just wanted both you and Selena Batten know what was happening.

## 2011-12-26 NOTE — Telephone Encounter (Signed)
Noted.  Thanks for trying - can we ask her to get Dr. Geryl Rankins opinion and any ideas as to how to proceed - ie see what he things about pt coming in for INR.   I do recommend we have her come in after surgery for INR check on Monday or Tuesday (this will tell us when we can stop bridging lovenox shots)

## 2012-01-01 ENCOUNTER — Ambulatory Visit (INDEPENDENT_AMBULATORY_CARE_PROVIDER_SITE_OTHER): Payer: BC Managed Care – PPO | Admitting: Family Medicine

## 2012-01-01 DIAGNOSIS — Z7901 Long term (current) use of anticoagulants: Secondary | ICD-10-CM

## 2012-01-01 DIAGNOSIS — Z7902 Long term (current) use of antithrombotics/antiplatelets: Secondary | ICD-10-CM

## 2012-01-01 DIAGNOSIS — I82409 Acute embolism and thrombosis of unspecified deep veins of unspecified lower extremity: Secondary | ICD-10-CM

## 2012-01-01 LAB — POCT INR: INR: 1.4

## 2012-01-01 NOTE — Telephone Encounter (Signed)
Great.  Thanks

## 2012-01-01 NOTE — Progress Notes (Signed)
Noted INR 1.4 - please fax attention Dr. Al Corpus.  To start lovenox after surgery and restart coumadin on night of surgery.[name]

## 2012-01-01 NOTE — Telephone Encounter (Signed)
Spoke with patient. She is going to come in next week on Monday or Tuesday after surgery for protime and said she may just have to have it checked in her car. I advised her that would be fine.

## 2012-01-01 NOTE — Patient Instructions (Addendum)
Holding now for surgery tomorrow, will start Lovenox tomorrow, check in 5 days. Will also resume dose of  5 mg daily except T/TH, Sat take 2.5 mg

## 2012-01-06 ENCOUNTER — Ambulatory Visit (INDEPENDENT_AMBULATORY_CARE_PROVIDER_SITE_OTHER): Payer: BC Managed Care – PPO | Admitting: Family Medicine

## 2012-01-06 DIAGNOSIS — Z7902 Long term (current) use of antithrombotics/antiplatelets: Secondary | ICD-10-CM

## 2012-01-06 DIAGNOSIS — I82409 Acute embolism and thrombosis of unspecified deep veins of unspecified lower extremity: Secondary | ICD-10-CM

## 2012-01-06 DIAGNOSIS — Z7901 Long term (current) use of anticoagulants: Secondary | ICD-10-CM

## 2012-01-06 NOTE — Patient Instructions (Addendum)
Increase to 10 mg for next 2 doses, continue lovenox and recheck Fri

## 2012-01-09 ENCOUNTER — Ambulatory Visit (INDEPENDENT_AMBULATORY_CARE_PROVIDER_SITE_OTHER): Payer: BC Managed Care – PPO | Admitting: Family Medicine

## 2012-01-09 DIAGNOSIS — I82409 Acute embolism and thrombosis of unspecified deep veins of unspecified lower extremity: Secondary | ICD-10-CM

## 2012-01-09 DIAGNOSIS — Z7901 Long term (current) use of anticoagulants: Secondary | ICD-10-CM

## 2012-01-09 DIAGNOSIS — Z7902 Long term (current) use of antithrombotics/antiplatelets: Secondary | ICD-10-CM

## 2012-01-09 LAB — POCT INR: INR: 3.4

## 2012-01-09 NOTE — Patient Instructions (Addendum)
DC the Lovenox injections today. Start regular dose of 5 mg daily, except 2.5 mg Tue/Thur/Sat. Check in 1 week

## 2012-01-16 ENCOUNTER — Ambulatory Visit (INDEPENDENT_AMBULATORY_CARE_PROVIDER_SITE_OTHER): Payer: BC Managed Care – PPO | Admitting: Family Medicine

## 2012-01-16 DIAGNOSIS — Z7902 Long term (current) use of antithrombotics/antiplatelets: Secondary | ICD-10-CM

## 2012-01-16 DIAGNOSIS — I82409 Acute embolism and thrombosis of unspecified deep veins of unspecified lower extremity: Secondary | ICD-10-CM

## 2012-01-16 DIAGNOSIS — Z7901 Long term (current) use of anticoagulants: Secondary | ICD-10-CM

## 2012-01-16 NOTE — Patient Instructions (Signed)
5 mg daily, except 2.5 mg Tue/Thur/Fir/Sat ( currently taking pain medication due to foot surgery.

## 2012-01-22 ENCOUNTER — Ambulatory Visit: Payer: BC Managed Care – PPO

## 2012-01-27 ENCOUNTER — Ambulatory Visit (INDEPENDENT_AMBULATORY_CARE_PROVIDER_SITE_OTHER): Payer: BC Managed Care – PPO | Admitting: General Practice

## 2012-01-27 DIAGNOSIS — I82409 Acute embolism and thrombosis of unspecified deep veins of unspecified lower extremity: Secondary | ICD-10-CM

## 2012-01-27 LAB — POCT INR: INR: 1.6

## 2012-02-05 ENCOUNTER — Ambulatory Visit (INDEPENDENT_AMBULATORY_CARE_PROVIDER_SITE_OTHER): Payer: BC Managed Care – PPO | Admitting: General Practice

## 2012-02-05 DIAGNOSIS — I82409 Acute embolism and thrombosis of unspecified deep veins of unspecified lower extremity: Secondary | ICD-10-CM

## 2012-03-04 ENCOUNTER — Ambulatory Visit: Payer: BC Managed Care – PPO

## 2012-03-11 ENCOUNTER — Ambulatory Visit (INDEPENDENT_AMBULATORY_CARE_PROVIDER_SITE_OTHER): Payer: BC Managed Care – PPO | Admitting: General Practice

## 2012-03-11 DIAGNOSIS — I82409 Acute embolism and thrombosis of unspecified deep veins of unspecified lower extremity: Secondary | ICD-10-CM

## 2012-03-25 ENCOUNTER — Ambulatory Visit (INDEPENDENT_AMBULATORY_CARE_PROVIDER_SITE_OTHER): Payer: BC Managed Care – PPO | Admitting: General Practice

## 2012-03-25 DIAGNOSIS — I82409 Acute embolism and thrombosis of unspecified deep veins of unspecified lower extremity: Secondary | ICD-10-CM

## 2012-03-25 LAB — POCT INR: INR: 1.7

## 2012-04-08 ENCOUNTER — Other Ambulatory Visit: Payer: Self-pay | Admitting: Infectious Diseases

## 2012-04-09 ENCOUNTER — Other Ambulatory Visit: Payer: Self-pay | Admitting: Licensed Clinical Social Worker

## 2012-04-09 MED ORDER — EMTRICITAB-RILPIVIR-TENOFOV DF 200-25-300 MG PO TABS: 30.0000 | ORAL_TABLET | Freq: Every day | ORAL | Status: DC

## 2012-04-13 ENCOUNTER — Other Ambulatory Visit: Payer: Self-pay | Admitting: *Deleted

## 2012-04-13 DIAGNOSIS — B20 Human immunodeficiency virus [HIV] disease: Secondary | ICD-10-CM

## 2012-04-13 MED ORDER — EMTRICITAB-RILPIVIR-TENOFOV DF 200-25-300 MG PO TABS: 1.0000 | ORAL_TABLET | Freq: Every day | ORAL | Status: DC

## 2012-04-15 ENCOUNTER — Ambulatory Visit (INDEPENDENT_AMBULATORY_CARE_PROVIDER_SITE_OTHER): Payer: BC Managed Care – PPO | Admitting: General Practice

## 2012-04-15 LAB — POCT INR: INR: 3

## 2012-04-26 ENCOUNTER — Ambulatory Visit (INDEPENDENT_AMBULATORY_CARE_PROVIDER_SITE_OTHER): Payer: BC Managed Care – PPO | Admitting: General Practice

## 2012-05-26 ENCOUNTER — Encounter: Payer: Self-pay | Admitting: *Deleted

## 2012-05-26 ENCOUNTER — Other Ambulatory Visit: Payer: Self-pay | Admitting: *Deleted

## 2012-05-26 ENCOUNTER — Other Ambulatory Visit: Payer: Self-pay | Admitting: Infectious Diseases

## 2012-05-26 DIAGNOSIS — B20 Human immunodeficiency virus [HIV] disease: Secondary | ICD-10-CM

## 2012-05-26 DIAGNOSIS — Z113 Encounter for screening for infections with a predominantly sexual mode of transmission: Secondary | ICD-10-CM

## 2012-05-26 DIAGNOSIS — Z79899 Other long term (current) drug therapy: Secondary | ICD-10-CM

## 2012-05-27 ENCOUNTER — Other Ambulatory Visit: Payer: BC Managed Care – PPO

## 2012-05-28 ENCOUNTER — Other Ambulatory Visit (INDEPENDENT_AMBULATORY_CARE_PROVIDER_SITE_OTHER): Payer: BC Managed Care – PPO

## 2012-05-28 ENCOUNTER — Other Ambulatory Visit: Payer: Self-pay | Admitting: Infectious Diseases

## 2012-05-28 DIAGNOSIS — Z79899 Other long term (current) drug therapy: Secondary | ICD-10-CM

## 2012-05-28 DIAGNOSIS — Z113 Encounter for screening for infections with a predominantly sexual mode of transmission: Secondary | ICD-10-CM

## 2012-05-28 DIAGNOSIS — B20 Human immunodeficiency virus [HIV] disease: Secondary | ICD-10-CM

## 2012-05-28 LAB — COMPLETE METABOLIC PANEL WITH GFR
Albumin: 3.5 g/dL (ref 3.5–5.2)
Alkaline Phosphatase: 123 U/L — ABNORMAL HIGH (ref 39–117)
CO2: 25 mEq/L (ref 19–32)
Chloride: 108 mEq/L (ref 96–112)
GFR, Est Non African American: 83 mL/min
Glucose, Bld: 79 mg/dL (ref 70–99)
Potassium: 3.8 mEq/L (ref 3.5–5.3)
Sodium: 139 mEq/L (ref 135–145)
Total Protein: 6.8 g/dL (ref 6.0–8.3)

## 2012-05-28 LAB — CBC WITH DIFFERENTIAL/PLATELET
Basophils Absolute: 0 10*3/uL (ref 0.0–0.1)
Basophils Relative: 1 % (ref 0–1)
Eosinophils Relative: 7 % — ABNORMAL HIGH (ref 0–5)
HCT: 41.2 % (ref 36.0–46.0)
MCHC: 33.3 g/dL (ref 30.0–36.0)
Monocytes Absolute: 0.2 10*3/uL (ref 0.1–1.0)
Neutro Abs: 1.6 10*3/uL — ABNORMAL LOW (ref 1.7–7.7)
RDW: 14.5 % (ref 11.5–15.5)

## 2012-05-28 LAB — LIPID PANEL
Cholesterol: 120 mg/dL (ref 0–200)
HDL: 25 mg/dL — ABNORMAL LOW (ref >39)
Total CHOL/HDL Ratio: 4.8 Ratio
Triglycerides: 179 mg/dL — ABNORMAL HIGH (ref <150)
VLDL: 36 mg/dL (ref 0–40)

## 2012-05-31 ENCOUNTER — Ambulatory Visit (INDEPENDENT_AMBULATORY_CARE_PROVIDER_SITE_OTHER): Payer: BC Managed Care – PPO | Admitting: General Practice

## 2012-05-31 DIAGNOSIS — Z7902 Long term (current) use of antithrombotics/antiplatelets: Secondary | ICD-10-CM

## 2012-05-31 DIAGNOSIS — Z7901 Long term (current) use of anticoagulants: Secondary | ICD-10-CM

## 2012-05-31 DIAGNOSIS — I82409 Acute embolism and thrombosis of unspecified deep veins of unspecified lower extremity: Secondary | ICD-10-CM

## 2012-05-31 LAB — HIV-1 RNA QUANT-NO REFLEX-BLD: HIV-1 RNA Quant, Log: 4.92 {Log} — ABNORMAL HIGH (ref <1.30)

## 2012-06-02 ENCOUNTER — Other Ambulatory Visit: Payer: Self-pay

## 2012-06-02 ENCOUNTER — Ambulatory Visit
Admission: RE | Admit: 2012-06-02 | Discharge: 2012-06-02 | Disposition: A | Discharge status: Home / Self Care | Payer: BC Managed Care – PPO | Source: Ambulatory Visit

## 2012-06-02 DIAGNOSIS — Z1231 Encounter for screening mammogram for malignant neoplasm of breast: Secondary | ICD-10-CM

## 2012-06-07 ENCOUNTER — Ambulatory Visit: Payer: BC Managed Care – PPO | Admitting: Family Medicine

## 2012-06-09 ENCOUNTER — Encounter: Payer: Self-pay | Admitting: Infectious Diseases

## 2012-06-09 ENCOUNTER — Ambulatory Visit (INDEPENDENT_AMBULATORY_CARE_PROVIDER_SITE_OTHER): Payer: BC Managed Care – PPO | Admitting: Infectious Diseases

## 2012-06-09 VITALS — BP 150/85 | Temp 98.8°F | Ht 63.5 in | Wt 206.0 lb

## 2012-06-09 DIAGNOSIS — I82409 Acute embolism and thrombosis of unspecified deep veins of unspecified lower extremity: Secondary | ICD-10-CM

## 2012-06-09 DIAGNOSIS — B20 Human immunodeficiency virus [HIV] disease: Secondary | ICD-10-CM

## 2012-06-09 NOTE — Assessment & Plan Note (Signed)
She has been on complera alone, will send her for repeat genotype, INI resistance, HLA testing. See her back in 4-6 weeks.

## 2012-06-09 NOTE — Assessment & Plan Note (Signed)
Taking coumadin, continues to f/u with Dr Cyndie Chime (needs to make appt).

## 2012-06-09 NOTE — Progress Notes (Signed)
  Subjective:    Patient ID: IPEK WESTRA, female    DOB: 1945/09/19, 67 y.o.   MRN: [id number]  HPI 67 yo F with HTN, cervical dysplasia, DVT (~3 years ago) and HIV+. 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  Has quit taking DRVr and wanted to see if this would effect her labs.   HIV 1 RNA Quant (copies/mL)  Date Value  05/28/2012 82390*  09/10/2011 <20   08/26/2011 853*     CD4 T Cell Abs (cmm)  Date Value  05/28/2012 140*  08/26/2011 260*  04/15/2011 250*   Has been doing well- feeling well. Just home from road trip to [place] and back.   Last PAP 09-2011- normal.   Review of Systems  Constitutional: Negative for appetite change and unexpected weight change.  Gastrointestinal: Negative for diarrhea and constipation.  Genitourinary: Negative for difficulty urinating.       Objective:   Physical Exam  Constitutional: She appears well-developed and well-nourished.  HENT:  Mouth/Throat: No oropharyngeal exudate.  Eyes: EOM are normal. Pupils are equal, round, and reactive to light.  Neck: Neck supple.  Cardiovascular: Normal rate, regular rhythm and normal heart sounds.   Pulmonary/Chest: Effort normal and breath sounds normal.  Abdominal: Soft. Bowel sounds are normal. There is no tenderness.          Assessment & Plan:

## 2012-06-10 ENCOUNTER — Ambulatory Visit: Payer: BC Managed Care – PPO | Admitting: Infectious Diseases

## 2012-06-14 LAB — HIV-1 GENOTYPR PLUS

## 2012-06-15 LAB — HIV-1 INTEGRASE GENOTYPE

## 2012-06-16 ENCOUNTER — Ambulatory Visit: Payer: BC Managed Care – PPO

## 2012-06-17 ENCOUNTER — Telehealth: Payer: Self-pay | Admitting: Infectious Diseases

## 2012-06-17 NOTE — Telephone Encounter (Signed)
Called Mrs. Merriwether and left an V/M for her to return my call concerning her medications.

## 2012-06-21 ENCOUNTER — Ambulatory Visit (INDEPENDENT_AMBULATORY_CARE_PROVIDER_SITE_OTHER): Payer: Self-pay | Admitting: General Practice

## 2012-06-21 DIAGNOSIS — Z7901 Long term (current) use of anticoagulants: Secondary | ICD-10-CM

## 2012-06-21 DIAGNOSIS — I82409 Acute embolism and thrombosis of unspecified deep veins of unspecified lower extremity: Secondary | ICD-10-CM

## 2012-06-21 DIAGNOSIS — Z7902 Long term (current) use of antithrombotics/antiplatelets: Secondary | ICD-10-CM

## 2012-06-21 LAB — POCT INR: INR: 2.5

## 2012-06-21 NOTE — Progress Notes (Signed)
Routed to PCP 

## 2012-06-23 ENCOUNTER — Telehealth: Payer: Self-pay | Admitting: Infectious Diseases

## 2012-06-23 NOTE — Telephone Encounter (Signed)
Called [name] again and left another V/M asking her to return my call concerning help with her medications.

## 2012-06-28 ENCOUNTER — Telehealth: Payer: Self-pay

## 2012-06-28 NOTE — Telephone Encounter (Signed)
Patient was in to do RW/ADAP application as she lost job and insurance, after adding income along with SSI, for her and spouse, patient is over income for program - tried to reach both patient and spouse at both their numbers and neither have called back - Randolm Idol, who is trying to help with patient assistance has also called repeatedly and can only leave a message - patient not returned either of our calls.

## 2012-06-29 ENCOUNTER — Other Ambulatory Visit: Payer: Self-pay | Admitting: *Deleted

## 2012-06-29 ENCOUNTER — Telehealth: Payer: Self-pay

## 2012-06-29 DIAGNOSIS — B20 Human immunodeficiency virus [HIV] disease: Secondary | ICD-10-CM

## 2012-06-29 MED ORDER — DARUNAVIR ETHANOLATE 800 MG PO TABS: 800.0000 mg | ORAL_TABLET | Freq: Every day | ORAL | Status: DC

## 2012-06-29 MED ORDER — RITONAVIR 100 MG PO TABS: 100.0000 mg | ORAL_TABLET | Freq: Every day | ORAL | Status: DC

## 2012-06-29 MED ORDER — EMTRICITAB-RILPIVIR-TENOFOV DF 200-25-300 MG PO TABS: 1.0000 | ORAL_TABLET | Freq: Every day | ORAL | Status: DC

## 2012-06-29 NOTE — Telephone Encounter (Signed)
Genotype testing ordered at last visit.  Next OV June 4th.  Printed rx for ADAP application.  MD signed application and rx.  Returned to B. Jeraldine Loots.

## 2012-06-29 NOTE — Telephone Encounter (Signed)
Patient came in on 4/30 to apply for RW/ADAP - she is over income - tried to call patient back for over a week and just spoke with her today - advised I would go ahead and send to adap, but would most likely be denied - she spoke with Randolm Idol who said she would help her with patient assistance when she came for Dr appt on 6/4 - unfortunately, she is over income for RW as well and let her know - her Medicare does not start until July 1st, but she did not want to change appt - she said Dr Ninetta Lights told her not to take the meds she has until he saw her again. She had labs done on 4/41/14.

## 2012-07-14 ENCOUNTER — Telehealth: Payer: Self-pay

## 2012-07-14 NOTE — Telephone Encounter (Signed)
Received letter from ADAP - patient denied due to income - left vm message and advised her to call Randolm Idol in patient asst., who will be back in office on 6/4 - also let her know that she was not eligible for RW as well due to income and not being on program in the past year as she had insurance.

## 2012-07-19 ENCOUNTER — Ambulatory Visit: Payer: Self-pay

## 2012-07-21 ENCOUNTER — Other Ambulatory Visit: Payer: Self-pay | Admitting: Licensed Clinical Social Worker

## 2012-07-21 ENCOUNTER — Ambulatory Visit (INDEPENDENT_AMBULATORY_CARE_PROVIDER_SITE_OTHER): Payer: Self-pay | Admitting: Infectious Diseases

## 2012-07-21 ENCOUNTER — Encounter: Payer: Self-pay | Admitting: Infectious Diseases

## 2012-07-21 VITALS — BP 130/86 | HR 54 | Temp 98.1°F | Ht 63.5 in | Wt 209.0 lb

## 2012-07-21 DIAGNOSIS — B20 Human immunodeficiency virus [HIV] disease: Secondary | ICD-10-CM

## 2012-07-21 DIAGNOSIS — R21 Rash and other nonspecific skin eruption: Secondary | ICD-10-CM

## 2012-07-21 MED ORDER — DAPSONE 100 MG PO TABS: 100.0000 mg | ORAL_TABLET | Freq: Every day | ORAL | Status: DC

## 2012-07-21 MED ORDER — DARUNAVIR ETHANOLATE 800 MG PO TABS: 800.0000 mg | ORAL_TABLET | Freq: Every day | ORAL | Status: DC

## 2012-07-21 MED ORDER — RITONAVIR 100 MG PO TABS: 100.0000 mg | ORAL_TABLET | Freq: Every day | ORAL | Status: DC

## 2012-07-21 MED ORDER — CLOTRIMAZOLE-BETAMETHASONE 1-0.05 % EX CREA: TOPICAL_CREAM | Freq: Two times a day (BID) | CUTANEOUS | Status: DC

## 2012-07-21 MED ORDER — DOLUTEGRAVIR SODIUM 50 MG PO TABS: 50.0000 mg | ORAL_TABLET | Freq: Every day | ORAL | Status: DC

## 2012-07-21 MED ORDER — EMTRICITABINE-TENOFOVIR DF 200-300 MG PO TABS: 1.0000 | ORAL_TABLET | Freq: Every day | ORAL | Status: DC

## 2012-07-21 NOTE — Progress Notes (Signed)
  Subjective:    Patient ID: [name], female    DOB: 11/20/1945, 67 y.o.   MRN: [id number]  HPI 67 yo F with HTN, cervical dysplasia, DVT (~3 years ago) and HIV+. 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  Has quit taking DRVr and wanted to see if this would effect her labs.  Has been working part-time at [employer].  Is off ART at this point. Feels "geat". Has rash on the back of her neck. Can't find anything on internet that match. Has had for > 1 month. Has been putting camphophenique on them.   Review of Systems     Objective:   Physical Exam  Constitutional: She appears well-developed and well-nourished.  Skin:             Assessment & Plan:

## 2012-07-21 NOTE — Assessment & Plan Note (Signed)
Will try clotitrimazole

## 2012-07-21 NOTE — Assessment & Plan Note (Signed)
Will try to get her into with med assistance to get her started on DRVr/TRV/DTGV. Needs to start bactrim for PCP prophylaxis.

## 2012-07-22 ENCOUNTER — Ambulatory Visit (INDEPENDENT_AMBULATORY_CARE_PROVIDER_SITE_OTHER): Payer: Self-pay | Admitting: Family Medicine

## 2012-07-22 DIAGNOSIS — Z7902 Long term (current) use of antithrombotics/antiplatelets: Secondary | ICD-10-CM

## 2012-07-22 DIAGNOSIS — Z7901 Long term (current) use of anticoagulants: Secondary | ICD-10-CM

## 2012-07-22 DIAGNOSIS — I82409 Acute embolism and thrombosis of unspecified deep veins of unspecified lower extremity: Secondary | ICD-10-CM

## 2012-07-27 ENCOUNTER — Telehealth: Payer: Self-pay | Admitting: *Deleted

## 2012-07-27 NOTE — Telephone Encounter (Signed)
Mailed application for Norvir to Mrs. Jillson for her to sign and send to [facility] Patient Assistance

## 2012-08-17 ENCOUNTER — Telehealth: Payer: Self-pay | Admitting: *Deleted

## 2012-08-17 NOTE — Telephone Encounter (Signed)
[name] was approved through the [facility] today for assistance with her copays.  She still needs to get the Part D ID number.  She will call the foundation as soon as she receives it.

## 2012-08-19 ENCOUNTER — Ambulatory Visit (INDEPENDENT_AMBULATORY_CARE_PROVIDER_SITE_OTHER): Payer: Medicare Other | Admitting: Family Medicine

## 2012-08-19 DIAGNOSIS — Z7902 Long term (current) use of antithrombotics/antiplatelets: Secondary | ICD-10-CM

## 2012-08-19 DIAGNOSIS — Z7901 Long term (current) use of anticoagulants: Secondary | ICD-10-CM

## 2012-08-19 DIAGNOSIS — I82409 Acute embolism and thrombosis of unspecified deep veins of unspecified lower extremity: Secondary | ICD-10-CM

## 2012-08-24 ENCOUNTER — Other Ambulatory Visit: Payer: Self-pay | Admitting: Licensed Clinical Social Worker

## 2012-08-24 DIAGNOSIS — B20 Human immunodeficiency virus [HIV] disease: Secondary | ICD-10-CM

## 2012-08-24 MED ORDER — EMTRICITABINE-TENOFOVIR DF 200-300 MG PO TABS: 1.0000 | ORAL_TABLET | Freq: Every day | ORAL | Status: DC

## 2012-08-24 MED ORDER — DOLUTEGRAVIR SODIUM 50 MG PO TABS: 50.0000 mg | ORAL_TABLET | Freq: Every day | ORAL | Status: DC

## 2012-09-01 ENCOUNTER — Ambulatory Visit (INDEPENDENT_AMBULATORY_CARE_PROVIDER_SITE_OTHER): Payer: Medicare Other | Admitting: Infectious Diseases

## 2012-09-01 ENCOUNTER — Encounter: Payer: Self-pay | Admitting: Infectious Diseases

## 2012-09-01 VITALS — BP 138/80 | HR 56 | Temp 98.4°F

## 2012-09-01 DIAGNOSIS — N879 Dysplasia of cervix uteri, unspecified: Secondary | ICD-10-CM

## 2012-09-01 DIAGNOSIS — B20 Human immunodeficiency virus [HIV] disease: Secondary | ICD-10-CM

## 2012-09-01 DIAGNOSIS — I498 Other specified cardiac arrhythmias: Secondary | ICD-10-CM

## 2012-09-01 DIAGNOSIS — I82409 Acute embolism and thrombosis of unspecified deep veins of unspecified lower extremity: Secondary | ICD-10-CM

## 2012-09-01 DIAGNOSIS — R001 Bradycardia, unspecified: Secondary | ICD-10-CM

## 2012-09-01 NOTE — Assessment & Plan Note (Signed)
She has f/u appt with Dr Cyndie Chime.

## 2012-09-01 NOTE — Assessment & Plan Note (Signed)
She is going to call for f/u appt.

## 2012-09-01 NOTE — Progress Notes (Signed)
  Subjective:    Patient ID: [name], female    DOB: 03-12-1945, 67 y.o.   MRN: [id number]  HPI 67 yo F with hx of cervical dysplasia, HIV+. At her previous visit was changed to DRVr/DTGV/TRV.  No problems with new art. May have gained a few pounds. Last PAP August 2013, nl.  Taking coumadin for DVT.   Review of Systems     Objective:   Physical Exam  Constitutional: She appears well-developed and well-nourished.  HENT:  Mouth/Throat: No oropharyngeal exudate.  Eyes: EOM are normal. Pupils are equal, round, and reactive to light.  Neck: Neck supple.  Cardiovascular: Normal rate, regular rhythm and normal heart sounds.   Pulmonary/Chest: Effort normal and breath sounds normal.  Abdominal: Soft. Bowel sounds are normal. There is no tenderness.  Lymphadenopathy:    She has no cervical adenopathy.          Assessment & Plan:

## 2012-09-01 NOTE — Assessment & Plan Note (Signed)
Appears to be doing well. Will check her CD4 and VL today, if CD4 < 200 will restart dapsone. Took for 30 days.  rtc 4-5 months.

## 2012-09-01 NOTE — Assessment & Plan Note (Signed)
States she is going to make f/u appt with [facility] cardiology.

## 2012-09-02 LAB — T-HELPER CELL (CD4) - (RCID CLINIC ONLY)
CD4 % Helper T Cell: 19 % — ABNORMAL LOW (ref 33–55)
CD4 T Cell Abs: 230 uL — ABNORMAL LOW (ref 400–2700)

## 2012-09-02 LAB — HIV-1 RNA QUANT-NO REFLEX-BLD
HIV 1 RNA Quant: 24 copies/mL — ABNORMAL HIGH (ref <20)
HIV-1 RNA Quant, Log: 1.38 {Log} — ABNORMAL HIGH (ref <1.30)

## 2012-09-06 ENCOUNTER — Telehealth: Payer: Self-pay | Admitting: Oncology

## 2012-09-06 NOTE — Telephone Encounter (Signed)
pt called and cx'd lb 7/22 and f/u 7/29 and did not r/s. per pt she is seeing both Dr. Ninetta Lights and Dr. Sharen Hones on a regular basis and does not think she needs to keep coming here - message to [facility]

## 2012-09-07 ENCOUNTER — Other Ambulatory Visit: Payer: BC Managed Care – PPO | Admitting: Lab

## 2012-09-13 ENCOUNTER — Telehealth: Payer: Self-pay | Admitting: Oncology

## 2012-09-13 NOTE — Telephone Encounter (Signed)
Per 7/23 staff message response from [facility] pt can come prn. No need to r/s appts pt cx'd.

## 2012-09-14 ENCOUNTER — Ambulatory Visit: Payer: BC Managed Care – PPO | Admitting: Oncology

## 2012-09-21 ENCOUNTER — Other Ambulatory Visit: Payer: Self-pay

## 2012-09-21 NOTE — Telephone Encounter (Signed)
Pt left v/m requesting refill voltaren gel to [facility].Please advise.

## 2012-09-22 MED ORDER — DICLOFENAC SODIUM 1 % TD GEL: 1.0000 "application " | Freq: Four times a day (QID) | TRANSDERMAL | Status: DC

## 2012-09-24 ENCOUNTER — Other Ambulatory Visit: Payer: Self-pay | Admitting: Licensed Clinical Social Worker

## 2012-09-24 DIAGNOSIS — B20 Human immunodeficiency virus [HIV] disease: Secondary | ICD-10-CM

## 2012-09-24 MED ORDER — DARUNAVIR ETHANOLATE 800 MG PO TABS: 800.0000 mg | ORAL_TABLET | Freq: Every day | ORAL | Status: DC

## 2012-09-30 ENCOUNTER — Encounter: Payer: Self-pay | Admitting: Infectious Diseases

## 2012-09-30 ENCOUNTER — Ambulatory Visit (INDEPENDENT_AMBULATORY_CARE_PROVIDER_SITE_OTHER): Payer: Medicare Other | Admitting: Family Medicine

## 2012-09-30 ENCOUNTER — Other Ambulatory Visit: Payer: Self-pay | Admitting: Family Medicine

## 2012-09-30 ENCOUNTER — Ambulatory Visit: Payer: Medicare Other

## 2012-09-30 DIAGNOSIS — K912 Postsurgical malabsorption, not elsewhere classified: Secondary | ICD-10-CM

## 2012-09-30 DIAGNOSIS — D511 Vitamin B12 deficiency anemia due to selective vitamin B12 malabsorption with proteinuria: Secondary | ICD-10-CM

## 2012-09-30 DIAGNOSIS — Z8679 Personal history of other diseases of the circulatory system: Secondary | ICD-10-CM

## 2012-09-30 DIAGNOSIS — I82409 Acute embolism and thrombosis of unspecified deep veins of unspecified lower extremity: Secondary | ICD-10-CM

## 2012-09-30 DIAGNOSIS — Z7901 Long term (current) use of anticoagulants: Secondary | ICD-10-CM

## 2012-09-30 DIAGNOSIS — Z7902 Long term (current) use of antithrombotics/antiplatelets: Secondary | ICD-10-CM

## 2012-09-30 DIAGNOSIS — D508 Other iron deficiency anemias: Secondary | ICD-10-CM

## 2012-09-30 LAB — POCT INR: INR: 3

## 2012-09-30 MED ORDER — WARFARIN SODIUM 5 MG PO TABS: ORAL_TABLET | ORAL | Status: DC

## 2012-11-01 ENCOUNTER — Telehealth: Payer: Self-pay | Admitting: *Deleted

## 2012-11-01 NOTE — Telephone Encounter (Signed)
She should be on darunavir 800mg  qday, truvada 1 qday, norvir 100mg  qday, tivicay 50mg  qday

## 2012-11-01 NOTE — Telephone Encounter (Signed)
Dr. Ninetta Lights please see [name]'s email question. She has concerns about what regimen she should be on. I told her in an email that you would return to clinic this Wednesday and if she needs to be worked in to please call the clinic. Thanks, Wendall Mola CMA

## 2012-11-04 NOTE — Telephone Encounter (Signed)
Patient notified. [name] CMA  

## 2012-11-11 ENCOUNTER — Ambulatory Visit (INDEPENDENT_AMBULATORY_CARE_PROVIDER_SITE_OTHER): Payer: Medicare Other | Admitting: Family Medicine

## 2012-11-11 DIAGNOSIS — I82409 Acute embolism and thrombosis of unspecified deep veins of unspecified lower extremity: Secondary | ICD-10-CM

## 2012-11-11 DIAGNOSIS — Z7902 Long term (current) use of antithrombotics/antiplatelets: Secondary | ICD-10-CM

## 2012-11-11 DIAGNOSIS — Z7901 Long term (current) use of anticoagulants: Secondary | ICD-10-CM

## 2012-11-11 LAB — POCT INR: INR: 2.7

## 2012-11-18 ENCOUNTER — Other Ambulatory Visit (HOSPITAL_COMMUNITY)
Admission: RE | Admit: 2012-11-18 | Discharge: 2012-11-18 | Disposition: A | Discharge status: Home / Self Care | Payer: Medicare Other | Source: Ambulatory Visit | Attending: Obstetrics and Gynecology | Admitting: Obstetrics and Gynecology

## 2012-11-18 ENCOUNTER — Ambulatory Visit: Payer: Medicare Other

## 2012-11-18 ENCOUNTER — Other Ambulatory Visit: Payer: Self-pay | Admitting: Obstetrics and Gynecology

## 2012-11-18 DIAGNOSIS — Z124 Encounter for screening for malignant neoplasm of cervix: Secondary | ICD-10-CM | POA: Insufficient documentation

## 2012-11-25 ENCOUNTER — Ambulatory Visit: Payer: Medicare Other

## 2012-11-25 ENCOUNTER — Ambulatory Visit (INDEPENDENT_AMBULATORY_CARE_PROVIDER_SITE_OTHER): Payer: Medicare Other

## 2012-11-25 DIAGNOSIS — Z23 Encounter for immunization: Secondary | ICD-10-CM

## 2012-12-23 ENCOUNTER — Ambulatory Visit (INDEPENDENT_AMBULATORY_CARE_PROVIDER_SITE_OTHER): Payer: Medicare Other | Admitting: Family Medicine

## 2012-12-23 DIAGNOSIS — Z7901 Long term (current) use of anticoagulants: Secondary | ICD-10-CM

## 2012-12-23 DIAGNOSIS — I82409 Acute embolism and thrombosis of unspecified deep veins of unspecified lower extremity: Secondary | ICD-10-CM

## 2012-12-23 DIAGNOSIS — Z7902 Long term (current) use of antithrombotics/antiplatelets: Secondary | ICD-10-CM

## 2012-12-23 LAB — POCT INR: INR: 2.6

## 2012-12-24 ENCOUNTER — Other Ambulatory Visit: Payer: Self-pay | Admitting: Infectious Diseases

## 2013-01-19 ENCOUNTER — Other Ambulatory Visit: Payer: Self-pay | Admitting: Family Medicine

## 2013-01-19 ENCOUNTER — Other Ambulatory Visit: Payer: Self-pay | Admitting: Infectious Diseases

## 2013-01-19 NOTE — Telephone Encounter (Signed)
Ok to refill 

## 2013-02-03 ENCOUNTER — Ambulatory Visit (INDEPENDENT_AMBULATORY_CARE_PROVIDER_SITE_OTHER): Payer: Medicare Other | Admitting: Family Medicine

## 2013-02-03 ENCOUNTER — Other Ambulatory Visit: Payer: Medicare Other

## 2013-02-03 DIAGNOSIS — B20 Human immunodeficiency virus [HIV] disease: Secondary | ICD-10-CM

## 2013-02-03 DIAGNOSIS — Z7902 Long term (current) use of antithrombotics/antiplatelets: Secondary | ICD-10-CM

## 2013-02-03 DIAGNOSIS — I82409 Acute embolism and thrombosis of unspecified deep veins of unspecified lower extremity: Secondary | ICD-10-CM

## 2013-02-03 DIAGNOSIS — Z7901 Long term (current) use of anticoagulants: Secondary | ICD-10-CM

## 2013-02-03 LAB — COMPLETE METABOLIC PANEL WITH GFR
AST: 21 U/L (ref 0–37)
Albumin: 3.8 g/dL (ref 3.5–5.2)
Alkaline Phosphatase: 116 U/L (ref 39–117)
BUN: 18 mg/dL (ref 6–23)
CO2: 25 mEq/L (ref 19–32)
GFR, Est African American: 89 mL/min
GFR, Est Non African American: 78 mL/min
Glucose, Bld: 82 mg/dL (ref 70–99)
Potassium: 4.5 mEq/L (ref 3.5–5.3)
Total Bilirubin: 0.6 mg/dL (ref 0.3–1.2)

## 2013-02-03 LAB — POCT INR: INR: 3

## 2013-02-04 LAB — T-HELPER CELL (CD4) - (RCID CLINIC ONLY): CD4 T Cell Abs: 240 /uL — ABNORMAL LOW (ref 400–2700)

## 2013-02-04 LAB — HIV-1 RNA QUANT-NO REFLEX-BLD
HIV 1 RNA Quant: 61 copies/mL — ABNORMAL HIGH (ref <20)
HIV-1 RNA Quant, Log: 1.79 {Log} — ABNORMAL HIGH (ref <1.30)

## 2013-02-21 ENCOUNTER — Other Ambulatory Visit: Payer: Self-pay | Admitting: *Deleted

## 2013-02-21 ENCOUNTER — Ambulatory Visit (INDEPENDENT_AMBULATORY_CARE_PROVIDER_SITE_OTHER): Payer: Medicare HMO | Admitting: Infectious Diseases

## 2013-02-21 ENCOUNTER — Encounter: Payer: Self-pay | Admitting: Infectious Diseases

## 2013-02-21 VITALS — BP 127/85 | HR 71 | Temp 98.3°F | Ht 63.5 in | Wt 215.0 lb

## 2013-02-21 DIAGNOSIS — B20 Human immunodeficiency virus [HIV] disease: Secondary | ICD-10-CM

## 2013-02-21 DIAGNOSIS — Z113 Encounter for screening for infections with a predominantly sexual mode of transmission: Secondary | ICD-10-CM

## 2013-02-21 DIAGNOSIS — Z79899 Other long term (current) drug therapy: Secondary | ICD-10-CM

## 2013-02-21 MED ORDER — EMTRICITABINE-TENOFOVIR DF 200-300 MG PO TABS: 1.0000 | ORAL_TABLET | Freq: Every day | ORAL | Status: DC

## 2013-02-21 MED ORDER — DOLUTEGRAVIR SODIUM 50 MG PO TABS: 50.0000 mg | ORAL_TABLET | Freq: Every day | ORAL | Status: DC

## 2013-02-21 NOTE — Assessment & Plan Note (Signed)
She is doing better. CD4 is stable, VL is small. [facility]. She is taking her meds well. Has gotten flu shot. She asks about shingles vax- I would not recommend this. She does not need PNVX til 2017. Needs Hep B screen at next lab.

## 2013-02-21 NOTE — Progress Notes (Signed)
   Subjective:    Patient ID: [name], female    DOB: 03/18/1945, 68 y.o.   MRN: [id number]  HPI 68 yo F with hx of cervical dysplasia, HIV+. At her previous visit was changed to DRVr/DTGV/TRV.  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  Has had some issues with missed timing, no missed doses.  Has gotten sore throat yesterday, feels like she is coming down with flu. No cough, no fever. Sore throat better.  Normal PAP 11-2012.   HIV 1 RNA Quant (copies/mL)  Date Value  02/03/2013 61*  09/01/2012 24*  05/28/2012 82390*     CD4 T Cell Abs (/uL)  Date Value  02/03/2013 240*  09/01/2012 230*  05/28/2012 140*   Review of Systems  Constitutional: Negative for fever and chills.  HENT: Positive for rhinorrhea and sore throat.   Gastrointestinal: Negative for diarrhea and constipation.  Genitourinary: Negative for difficulty urinating.       Objective:   Physical Exam  Constitutional: She appears well-developed and well-nourished.  HENT:  Mouth/Throat: No oropharyngeal exudate.  Eyes: EOM are normal. Pupils are equal, round, and reactive to light.  Neck: Neck supple.  Cardiovascular: Normal rate, regular rhythm and normal heart sounds.   Pulmonary/Chest: Effort normal and breath sounds normal.  Abdominal: Soft. Bowel sounds are normal. There is no tenderness.  Lymphadenopathy:    She has no cervical adenopathy.          Assessment & Plan:

## 2013-03-17 ENCOUNTER — Ambulatory Visit (INDEPENDENT_AMBULATORY_CARE_PROVIDER_SITE_OTHER): Payer: Medicare HMO | Admitting: Family Medicine

## 2013-03-17 DIAGNOSIS — Z7901 Long term (current) use of anticoagulants: Secondary | ICD-10-CM

## 2013-03-17 DIAGNOSIS — Z7902 Long term (current) use of antithrombotics/antiplatelets: Secondary | ICD-10-CM

## 2013-03-17 DIAGNOSIS — I82409 Acute embolism and thrombosis of unspecified deep veins of unspecified lower extremity: Secondary | ICD-10-CM

## 2013-03-17 LAB — POCT INR: INR: 2.6

## 2013-04-21 ENCOUNTER — Other Ambulatory Visit: Payer: Self-pay | Admitting: Infectious Diseases

## 2013-04-28 ENCOUNTER — Ambulatory Visit (INDEPENDENT_AMBULATORY_CARE_PROVIDER_SITE_OTHER): Payer: Medicare HMO | Admitting: Family Medicine

## 2013-04-28 DIAGNOSIS — I82409 Acute embolism and thrombosis of unspecified deep veins of unspecified lower extremity: Secondary | ICD-10-CM

## 2013-04-28 DIAGNOSIS — Z7901 Long term (current) use of anticoagulants: Secondary | ICD-10-CM

## 2013-04-28 DIAGNOSIS — Z7902 Long term (current) use of antithrombotics/antiplatelets: Secondary | ICD-10-CM

## 2013-04-28 LAB — POCT INR: INR: 1.3

## 2013-05-26 ENCOUNTER — Other Ambulatory Visit: Payer: Self-pay | Admitting: Family Medicine

## 2013-06-09 ENCOUNTER — Ambulatory Visit: Payer: Medicare HMO

## 2013-06-10 ENCOUNTER — Ambulatory Visit (INDEPENDENT_AMBULATORY_CARE_PROVIDER_SITE_OTHER): Payer: Commercial Managed Care - HMO | Admitting: Family Medicine

## 2013-06-10 DIAGNOSIS — Z7902 Long term (current) use of antithrombotics/antiplatelets: Secondary | ICD-10-CM

## 2013-06-10 DIAGNOSIS — I82409 Acute embolism and thrombosis of unspecified deep veins of unspecified lower extremity: Secondary | ICD-10-CM

## 2013-06-10 DIAGNOSIS — Z7901 Long term (current) use of anticoagulants: Secondary | ICD-10-CM

## 2013-06-10 LAB — POCT INR: INR: 2.7

## 2013-06-21 ENCOUNTER — Encounter: Payer: Self-pay | Admitting: Family Medicine

## 2013-06-24 ENCOUNTER — Other Ambulatory Visit: Payer: Self-pay | Admitting: Family Medicine

## 2013-06-24 DIAGNOSIS — Z1231 Encounter for screening mammogram for malignant neoplasm of breast: Secondary | ICD-10-CM

## 2013-07-06 ENCOUNTER — Other Ambulatory Visit: Payer: Self-pay | Admitting: Family Medicine

## 2013-07-06 ENCOUNTER — Ambulatory Visit
Admission: RE | Admit: 2013-07-06 | Discharge: 2013-07-06 | Disposition: A | Discharge status: Home / Self Care | Payer: Commercial Managed Care - HMO | Source: Ambulatory Visit | Attending: Family Medicine | Admitting: Family Medicine

## 2013-07-06 DIAGNOSIS — Z1231 Encounter for screening mammogram for malignant neoplasm of breast: Secondary | ICD-10-CM

## 2013-07-06 LAB — HM MAMMOGRAPHY: HM Mammogram: NORMAL

## 2013-07-07 ENCOUNTER — Encounter: Payer: Self-pay | Admitting: *Deleted

## 2013-07-21 ENCOUNTER — Ambulatory Visit (INDEPENDENT_AMBULATORY_CARE_PROVIDER_SITE_OTHER): Payer: Commercial Managed Care - HMO | Admitting: Family Medicine

## 2013-07-21 DIAGNOSIS — Z7902 Long term (current) use of antithrombotics/antiplatelets: Secondary | ICD-10-CM

## 2013-07-21 DIAGNOSIS — Z7901 Long term (current) use of anticoagulants: Secondary | ICD-10-CM

## 2013-07-21 DIAGNOSIS — I82409 Acute embolism and thrombosis of unspecified deep veins of unspecified lower extremity: Secondary | ICD-10-CM

## 2013-07-21 LAB — POCT INR: INR: 1.9

## 2013-07-22 ENCOUNTER — Other Ambulatory Visit: Payer: Self-pay | Admitting: Infectious Diseases

## 2013-07-22 DIAGNOSIS — B20 Human immunodeficiency virus [HIV] disease: Secondary | ICD-10-CM

## 2013-08-08 ENCOUNTER — Other Ambulatory Visit: Payer: Medicare Other

## 2013-08-17 ENCOUNTER — Other Ambulatory Visit: Payer: Commercial Managed Care - HMO

## 2013-08-21 ENCOUNTER — Other Ambulatory Visit: Payer: Self-pay | Admitting: Infectious Diseases

## 2013-08-21 ENCOUNTER — Other Ambulatory Visit: Payer: Self-pay | Admitting: Family Medicine

## 2013-08-22 ENCOUNTER — Ambulatory Visit: Payer: Medicare Other | Admitting: Infectious Diseases

## 2013-08-25 ENCOUNTER — Other Ambulatory Visit: Payer: Commercial Managed Care - HMO

## 2013-08-25 DIAGNOSIS — Z79899 Other long term (current) drug therapy: Secondary | ICD-10-CM

## 2013-08-25 DIAGNOSIS — Z113 Encounter for screening for infections with a predominantly sexual mode of transmission: Secondary | ICD-10-CM

## 2013-08-25 DIAGNOSIS — B20 Human immunodeficiency virus [HIV] disease: Secondary | ICD-10-CM

## 2013-08-25 LAB — CBC WITH DIFFERENTIAL/PLATELET
Basophils Absolute: 0 10*3/uL (ref 0.0–0.1)
Basophils Relative: 1 % (ref 0–1)
Eosinophils Absolute: 0.2 10*3/uL (ref 0.0–0.7)
Eosinophils Relative: 5 % (ref 0–5)
HCT: 39.3 % (ref 36.0–46.0)
Hemoglobin: 13.1 g/dL (ref 12.0–15.0)
Lymphocytes Relative: 31 % (ref 12–46)
Lymphs Abs: 1.1 10*3/uL (ref 0.7–4.0)
MCH: 28.7 pg (ref 26.0–34.0)
MCHC: 33.3 g/dL (ref 30.0–36.0)
MCV: 86 fL (ref 78.0–100.0)
Monocytes Absolute: 0.3 10*3/uL (ref 0.1–1.0)
Monocytes Relative: 7 % (ref 3–12)
Neutro Abs: 2 10*3/uL (ref 1.7–7.7)
Neutrophils Relative %: 56 % (ref 43–77)
Platelets: 223 10*3/uL (ref 150–400)
RBC: 4.57 MIL/uL (ref 3.87–5.11)
RDW: 15 % (ref 11.5–15.5)
WBC: 3.6 10*3/uL — ABNORMAL LOW (ref 4.0–10.5)

## 2013-08-26 LAB — HIV-1 RNA QUANT-NO REFLEX-BLD
HIV 1 RNA Quant: <20 copies/mL (ref <20)
HIV-1 RNA Quant, Log: <1.3 {Log} (ref <1.30)

## 2013-08-26 LAB — COMPLETE METABOLIC PANEL WITH GFR
ALT: 16 U/L (ref 0–35)
AST: 23 U/L (ref 0–37)
Albumin: 3.9 g/dL (ref 3.5–5.2)
Alkaline Phosphatase: 150 U/L — ABNORMAL HIGH (ref 39–117)
BILIRUBIN TOTAL: 0.3 mg/dL (ref 0.2–1.2)
BUN: 13 mg/dL (ref 6–23)
CO2: 25 mEq/L (ref 19–32)
Calcium: 8.7 mg/dL (ref 8.4–10.5)
Chloride: 103 mEq/L (ref 96–112)
Creat: 1.01 mg/dL (ref 0.50–1.10)
GFR, EST AFRICAN AMERICAN: 68 mL/min
GFR, EST NON AFRICAN AMERICAN: 59 mL/min — AB
GLUCOSE: 97 mg/dL (ref 70–99)
Potassium: 3.7 mEq/L (ref 3.5–5.3)
SODIUM: 136 meq/L (ref 135–145)
Total Protein: 6.9 g/dL (ref 6.0–8.3)

## 2013-08-26 LAB — LIPID PANEL
CHOLESTEROL: 198 mg/dL (ref 0–200)
HDL: 35 mg/dL — ABNORMAL LOW (ref >39)
LDL Cholesterol: 99 mg/dL (ref 0–99)
Total CHOL/HDL Ratio: 5.7 Ratio
Triglycerides: 322 mg/dL — ABNORMAL HIGH (ref <150)
VLDL: 64 mg/dL — ABNORMAL HIGH (ref 0–40)

## 2013-08-26 LAB — T-HELPER CELL (CD4) - (RCID CLINIC ONLY)
CD4 T CELL ABS: 280 /uL — AB (ref 400–2700)
CD4 T CELL HELPER: 25 % — AB (ref 33–55)

## 2013-08-26 LAB — RPR

## 2013-08-29 ENCOUNTER — Ambulatory Visit: Payer: Commercial Managed Care - HMO | Admitting: Infectious Diseases

## 2013-09-01 ENCOUNTER — Ambulatory Visit (INDEPENDENT_AMBULATORY_CARE_PROVIDER_SITE_OTHER): Payer: Commercial Managed Care - HMO | Admitting: Family Medicine

## 2013-09-01 DIAGNOSIS — Z7902 Long term (current) use of antithrombotics/antiplatelets: Secondary | ICD-10-CM

## 2013-09-01 DIAGNOSIS — Z7901 Long term (current) use of anticoagulants: Secondary | ICD-10-CM

## 2013-09-01 DIAGNOSIS — I82409 Acute embolism and thrombosis of unspecified deep veins of unspecified lower extremity: Secondary | ICD-10-CM

## 2013-09-01 LAB — POCT INR: INR: 3.1

## 2013-09-09 ENCOUNTER — Encounter: Payer: Self-pay | Admitting: Infectious Diseases

## 2013-09-09 ENCOUNTER — Ambulatory Visit (INDEPENDENT_AMBULATORY_CARE_PROVIDER_SITE_OTHER): Payer: Commercial Managed Care - HMO | Admitting: Infectious Diseases

## 2013-09-09 VITALS — BP 167/91 | HR 51 | Temp 97.2°F | Wt 229.0 lb

## 2013-09-09 DIAGNOSIS — I10 Essential (primary) hypertension: Secondary | ICD-10-CM

## 2013-09-09 DIAGNOSIS — N879 Dysplasia of cervix uteri, unspecified: Secondary | ICD-10-CM

## 2013-09-09 DIAGNOSIS — B20 Human immunodeficiency virus [HIV] disease: Secondary | ICD-10-CM

## 2013-09-09 DIAGNOSIS — E785 Hyperlipidemia, unspecified: Secondary | ICD-10-CM

## 2013-09-09 MED ORDER — DARUNAVIR-COBICISTAT 800-150 MG PO TABS: 1.0000 | ORAL_TABLET | Freq: Every day | ORAL | Status: DC

## 2013-09-09 NOTE — Assessment & Plan Note (Signed)
She is doing well virologically, immunologically. Could [facility] her [facility]? She was Hep B (-) vaccinated, need to rescreen post vaccine.  rtc 3-4 months.

## 2013-09-09 NOTE — Assessment & Plan Note (Signed)
Needs to f/u with GYN this fall.

## 2013-09-09 NOTE — Assessment & Plan Note (Signed)
She will f/u with PCP.  

## 2013-09-09 NOTE — Progress Notes (Signed)
   Subjective:    Patient ID: [name], female    DOB: 07/19/1945, 68 y.o.   MRN: [id number]  HPI 68 yo F with hx of cervical dysplasia, HIV+. At her previous visit was changed to DRVr/DTGV/TRV.  BP up today but has noticed no difference (headaches, cp).  Has been gaining wt. Clothes no longer fit. Having knee pain. Fatigued. Has been waking up at 2-3am and unable to go back to sleep. Taken otc (benadryl) with good relief. Melatonin did not help.  Has continued R flank pain.   HIV 1 RNA Quant (copies/mL)  Date Value  08/25/2013 <20   02/03/2013 61*  09/01/2012 24*     CD4 T Cell Abs (/uL)  Date Value  08/25/2013 280*  02/03/2013 240*  09/01/2012 230*    Review of Systems  Constitutional: Positive for unexpected weight change. Negative for appetite change.  Cardiovascular: Negative for chest pain.  Neurological: Negative for headaches.       Objective:   Physical Exam  Constitutional: She appears well-developed and well-nourished.  HENT:  Mouth/Throat: No oropharyngeal exudate.  Eyes: EOM are normal. Pupils are equal, round, and reactive to light.  Neck: Neck supple.  Cardiovascular: Normal rate, regular rhythm and normal heart sounds.   Pulmonary/Chest: Effort normal and breath sounds normal.  Abdominal: Soft. Bowel sounds are normal. She exhibits no distension. There is no tenderness.  Lymphadenopathy:    She has no cervical adenopathy.          Assessment & Plan:

## 2013-09-09 NOTE — Assessment & Plan Note (Signed)
Will work on diet and exercise. F/u with PCP.

## 2013-09-16 ENCOUNTER — Other Ambulatory Visit: Payer: Self-pay | Admitting: *Deleted

## 2013-09-16 DIAGNOSIS — B20 Human immunodeficiency virus [HIV] disease: Secondary | ICD-10-CM

## 2013-09-16 MED ORDER — DARUNAVIR-COBICISTAT 800-150 MG PO TABS: 1.0000 | ORAL_TABLET | Freq: Every day | ORAL | Status: AC

## 2013-10-13 ENCOUNTER — Ambulatory Visit (INDEPENDENT_AMBULATORY_CARE_PROVIDER_SITE_OTHER): Payer: Commercial Managed Care - HMO | Admitting: Family Medicine

## 2013-10-13 DIAGNOSIS — I82409 Acute embolism and thrombosis of unspecified deep veins of unspecified lower extremity: Secondary | ICD-10-CM

## 2013-10-13 DIAGNOSIS — Z7902 Long term (current) use of antithrombotics/antiplatelets: Secondary | ICD-10-CM

## 2013-10-13 DIAGNOSIS — Z7901 Long term (current) use of anticoagulants: Secondary | ICD-10-CM

## 2013-10-13 LAB — POCT INR: INR: 2.5

## 2013-11-21 ENCOUNTER — Ambulatory Visit (INDEPENDENT_AMBULATORY_CARE_PROVIDER_SITE_OTHER): Payer: Commercial Managed Care - HMO | Admitting: *Deleted

## 2013-11-21 DIAGNOSIS — I82409 Acute embolism and thrombosis of unspecified deep veins of unspecified lower extremity: Secondary | ICD-10-CM

## 2013-11-21 DIAGNOSIS — Z23 Encounter for immunization: Secondary | ICD-10-CM

## 2013-11-21 DIAGNOSIS — Z7902 Long term (current) use of antithrombotics/antiplatelets: Secondary | ICD-10-CM

## 2013-11-21 DIAGNOSIS — Z7901 Long term (current) use of anticoagulants: Secondary | ICD-10-CM

## 2013-11-21 LAB — POCT INR: INR: 3.4

## 2013-11-28 ENCOUNTER — Other Ambulatory Visit: Payer: Self-pay | Admitting: Family Medicine

## 2013-12-20 ENCOUNTER — Other Ambulatory Visit: Payer: Commercial Managed Care - HMO

## 2013-12-20 DIAGNOSIS — B20 Human immunodeficiency virus [HIV] disease: Secondary | ICD-10-CM

## 2013-12-21 LAB — COMPLETE METABOLIC PANEL WITH GFR
ALK PHOS: 143 U/L — AB (ref 39–117)
ALT: 12 U/L (ref 0–35)
AST: 20 U/L (ref 0–37)
Albumin: 3.3 g/dL — ABNORMAL LOW (ref 3.5–5.2)
BUN: 16 mg/dL (ref 6–23)
CALCIUM: 8.6 mg/dL (ref 8.4–10.5)
CO2: 26 mEq/L (ref 19–32)
Chloride: 106 mEq/L (ref 96–112)
Creat: 0.77 mg/dL (ref 0.50–1.10)
GFR, EST NON AFRICAN AMERICAN: 81 mL/min
GFR, Est African American: >89 mL/min
GLUCOSE: 89 mg/dL (ref 70–99)
Potassium: 3.9 mEq/L (ref 3.5–5.3)
Sodium: 140 mEq/L (ref 135–145)
Total Bilirubin: 0.3 mg/dL (ref 0.2–1.2)
Total Protein: 6.3 g/dL (ref 6.0–8.3)

## 2013-12-21 LAB — HEPATITIS B SURFACE ANTIBODY,QUALITATIVE: HEP B S AB: NEGATIVE

## 2013-12-22 LAB — T-HELPER CELL (CD4) - (RCID CLINIC ONLY)
CD4 % Helper T Cell: 24 % — ABNORMAL LOW (ref 33–55)
CD4 T Cell Abs: 280 /uL — ABNORMAL LOW (ref 400–2700)

## 2013-12-23 LAB — HIV-1 RNA QUANT-NO REFLEX-BLD
HIV 1 RNA Quant: 72 copies/mL — ABNORMAL HIGH (ref <20)
HIV-1 RNA Quant, Log: 1.86 {Log} — ABNORMAL HIGH (ref <1.30)

## 2014-01-02 ENCOUNTER — Ambulatory Visit: Payer: Commercial Managed Care - HMO

## 2014-01-02 ENCOUNTER — Other Ambulatory Visit (INDEPENDENT_AMBULATORY_CARE_PROVIDER_SITE_OTHER): Payer: Commercial Managed Care - HMO

## 2014-01-02 DIAGNOSIS — Z7902 Long term (current) use of antithrombotics/antiplatelets: Secondary | ICD-10-CM

## 2014-01-02 DIAGNOSIS — I82401 Acute embolism and thrombosis of unspecified deep veins of right lower extremity: Secondary | ICD-10-CM

## 2014-01-02 DIAGNOSIS — I82409 Acute embolism and thrombosis of unspecified deep veins of unspecified lower extremity: Secondary | ICD-10-CM

## 2014-01-02 DIAGNOSIS — Z7901 Long term (current) use of anticoagulants: Secondary | ICD-10-CM

## 2014-01-02 LAB — PROTIME-INR
INR: 3.3 ratio — AB (ref 0.8–1.0)
Prothrombin Time: 35.2 s — ABNORMAL HIGH (ref 9.6–13.1)

## 2014-01-10 ENCOUNTER — Encounter: Payer: Self-pay | Admitting: Infectious Diseases

## 2014-01-10 ENCOUNTER — Ambulatory Visit (INDEPENDENT_AMBULATORY_CARE_PROVIDER_SITE_OTHER): Payer: Commercial Managed Care - HMO | Admitting: Infectious Diseases

## 2014-01-10 VITALS — Temp 97.2°F | Wt 234.0 lb

## 2014-01-10 DIAGNOSIS — Z113 Encounter for screening for infections with a predominantly sexual mode of transmission: Secondary | ICD-10-CM

## 2014-01-10 DIAGNOSIS — Z79899 Other long term (current) drug therapy: Secondary | ICD-10-CM

## 2014-01-10 DIAGNOSIS — N879 Dysplasia of cervix uteri, unspecified: Secondary | ICD-10-CM

## 2014-01-10 DIAGNOSIS — B20 Human immunodeficiency virus [HIV] disease: Secondary | ICD-10-CM

## 2014-01-10 DIAGNOSIS — I82409 Acute embolism and thrombosis of unspecified deep veins of unspecified lower extremity: Secondary | ICD-10-CM

## 2014-01-10 NOTE — Assessment & Plan Note (Signed)
Have asked her to cal lher GYN for f/u.

## 2014-01-10 NOTE — Assessment & Plan Note (Signed)
Appreciate Dr Sharen HonesGutierrez, Cyndie ChimeGranfortuna f/u.

## 2014-01-10 NOTE — Progress Notes (Signed)
   Subjective:    Patient ID: Kathryn PaganiniCarmen L Golphin, female    DOB: 05/09/1945, 68 y.o.   MRN: [id number]  HPI 68 yo F with hx of cervical dysplasia, HIV+, previous gastric bypass. At her previous visit was changed to DRVc/DTGV/TRV.  Has gained 30# in the last 3 years. No change in eating habits, does not regularly exercise. Has [facility] but did not continue.  Is taking coumadin after found to have blood clot in her R leg, and old L leg clot. 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   Still have some occas arthralgias, using prn voltaren cream.   HIV 1 RNA QUANT (copies/mL)  Date Value  12/20/2013 72*  08/25/2013 <20  02/03/2013 61*   CD4 T CELL ABS (/uL)  Date Value  12/20/2013 280*  08/25/2013 280*  02/03/2013 240*   Review of Systems See hpi    Objective:   Physical Exam  Constitutional: She appears well-developed and well-nourished.  HENT:  Mouth/Throat: No oropharyngeal exudate.  Eyes: EOM are normal. Pupils are equal, round, and reactive to light.  Neck: Neck supple.  Cardiovascular: Normal rate, regular rhythm and normal heart sounds.   Pulmonary/Chest: Effort normal and breath sounds normal.  Abdominal: Soft. Bowel sounds are normal. She exhibits no distension. There is no tenderness.  Lymphadenopathy:    She has no cervical adenopathy.          Assessment & Plan:

## 2014-01-10 NOTE — Assessment & Plan Note (Signed)
Has gotten flu shot (while getting her coumadin checked).  Have encouraged her to exercise, watch diet.  She has blip again. Will leave her on her current rx. Will see her back in 6 months.

## 2014-01-17 ENCOUNTER — Other Ambulatory Visit: Payer: Self-pay | Admitting: Licensed Clinical Social Worker

## 2014-01-17 ENCOUNTER — Other Ambulatory Visit: Payer: Self-pay | Admitting: Infectious Diseases

## 2014-01-25 ENCOUNTER — Other Ambulatory Visit: Payer: Self-pay | Admitting: Family Medicine

## 2014-01-25 DIAGNOSIS — I82409 Acute embolism and thrombosis of unspecified deep veins of unspecified lower extremity: Secondary | ICD-10-CM

## 2014-01-30 ENCOUNTER — Other Ambulatory Visit (INDEPENDENT_AMBULATORY_CARE_PROVIDER_SITE_OTHER): Payer: Commercial Managed Care - HMO

## 2014-01-30 DIAGNOSIS — I82409 Acute embolism and thrombosis of unspecified deep veins of unspecified lower extremity: Secondary | ICD-10-CM

## 2014-01-31 ENCOUNTER — Encounter: Payer: Self-pay | Admitting: Family Medicine

## 2014-01-31 ENCOUNTER — Ambulatory Visit (INDEPENDENT_AMBULATORY_CARE_PROVIDER_SITE_OTHER): Payer: Commercial Managed Care - HMO | Admitting: Family Medicine

## 2014-01-31 VITALS — BP 128/80 | HR 54 | Temp 99.0°F | Wt 230.0 lb

## 2014-01-31 DIAGNOSIS — J209 Acute bronchitis, unspecified: Secondary | ICD-10-CM

## 2014-01-31 DIAGNOSIS — Z7901 Long term (current) use of anticoagulants: Secondary | ICD-10-CM

## 2014-01-31 DIAGNOSIS — B20 Human immunodeficiency virus [HIV] disease: Secondary | ICD-10-CM

## 2014-01-31 LAB — PROTIME-INR
INR: 2.8 ratio — AB (ref 0.8–1.0)
Prothrombin Time: 30.6 s — ABNORMAL HIGH (ref 9.6–13.1)

## 2014-01-31 LAB — POCT INFLUENZA A/B
INFLUENZA A, POC: NEGATIVE
INFLUENZA B, POC: NEGATIVE

## 2014-01-31 MED ORDER — AZITHROMYCIN 250 MG PO TABS: ORAL_TABLET | ORAL | Status: AC

## 2014-01-31 MED ORDER — ALBUTEROL SULFATE HFA 108 (90 BASE) MCG/ACT IN AERS: 2.0000 | INHALATION_SPRAY | Freq: Four times a day (QID) | RESPIRATORY_TRACT | Status: AC | PRN

## 2014-01-31 NOTE — Progress Notes (Signed)
Pre visit review using our clinic review tool, if applicable. No additional management support is needed unless otherwise documented below in the visit note. 

## 2014-01-31 NOTE — Addendum Note (Signed)
Addended by: Sueanne MargaritaSMITH, Reilley Valentine L on: 01/31/2014 05:00 PM   Modules accepted: Orders

## 2014-01-31 NOTE — Assessment & Plan Note (Addendum)
Anticipate acute bronchitis leading to mild asthma exacerbation. Treat aggressively, given hx asthma and HIV, with zpack. Push fluids and rest. Update if not improved as expected. Flu swab today - negative

## 2014-01-31 NOTE — Addendum Note (Signed)
Addended by: Eustaquio BoydenGUTIERREZ, Caydee Talkington on: 01/31/2014 05:19 PM   Modules accepted: Level of Service, SmartSet

## 2014-01-31 NOTE — Progress Notes (Signed)
BP 128/80 mmHg  Pulse 54  Temp(Src) 99 F (37.2 C) (Tympanic)  Wt 230 lb (104.327 kg)  SpO2 98%   CC: URI with cough  Subjective:    Patient ID: Kathryn Paganiniarmen L Swartzentruber, female    DOB: 06/04/1945, 68 y.o.   MRN: [id number]  HPI: [name] is a 68 y.o. female presenting on 01/31/2014 for Sinusitis and URI   5-6 day h/o fever, dry hacking cough, chest congestion and tightness, initially with ST and body aches. Progressively worsening. Headaches.   Clear nose, no ear or tooth pain, PNdrainage.  Has been using albuterol inhaler and dayquil.  Thinks caught this from husband.  H/o asthma, but no problems in last several years.  Known HIV Did receive flu shot  Relevant past medical, surgical, family and social history reviewed and updated as indicated. Interim medical history since our last visit reviewed. Allergies and medications reviewed and updated.  Current Outpatient Prescriptions on File Prior to Visit  Medication Sig  . darunavir-cobicistat (PREZCOBIX) 800-150 MG per tablet Take 1 tablet by mouth daily. Swallow whole. Do NOT crush, break or chew tablets. Take with food.  . dolutegravir (TIVICAY) 50 MG tablet Take 1 tablet (50 mg total) by mouth daily.  Marland Kitchen. emtricitabine-tenofovir (TRUVADA) 200-300 MG per tablet Take 1 tablet by mouth daily.  . VOLTAREN 1 % GEL APPLY TOPICALLY FOUR TIMES DAILY; MUST HAVE FOLLOW UP FOR FURTHER REFILLS  . warfarin (COUMADIN) 5 MG tablet TAKE AS DIRECTED BY COUMADIN CLINIC   No current facility-administered medications on file prior to visit.    Review of Systems Per HPI unless specifically indicated above     Objective:    BP 128/80 mmHg  Pulse 54  Temp(Src) 99 F (37.2 C) (Tympanic)  Wt 230 lb (104.327 kg)  SpO2 98%  Wt Readings from Last 3 Encounters:  01/31/14 230 lb (104.327 kg)  01/10/14 234 lb (106.142 kg)  09/09/13 229 lb (103.874 kg)    Physical Exam  Constitutional: She appears well-developed and well-nourished. No  distress.  HENT:  Head: Normocephalic and atraumatic.  Right Ear: Hearing, tympanic membrane, external ear and ear canal normal.  Left Ear: Hearing, tympanic membrane, external ear and ear canal normal.  Nose: Mucosal edema present. No rhinorrhea. Right sinus exhibits no maxillary sinus tenderness and no frontal sinus tenderness. Left sinus exhibits no maxillary sinus tenderness and no frontal sinus tenderness.  Mouth/Throat: Uvula is midline and mucous membranes are normal. Posterior oropharyngeal erythema present. No oropharyngeal exudate, posterior oropharyngeal edema or tonsillar abscesses.  Eyes: Conjunctivae and EOM are normal. Pupils are equal, round, and reactive to light. No scleral icterus.  Neck: Normal range of motion. Neck supple.  Cardiovascular: Normal rate, regular rhythm, normal heart sounds and intact distal pulses.   No murmur heard. Pulmonary/Chest: Effort normal and breath sounds normal. No respiratory distress. She has no wheezes. She has no rales.  Hacking cough  Lymphadenopathy:    She has no cervical adenopathy.  Skin: Skin is warm and dry. No rash noted.  Nursing note and vitals reviewed.      Assessment & Plan:   Problem List Items Addressed This Visit    Long term current use of anticoagulant therapy    Titrated coumadin dose accordingly while on zpack - take 1/2 dose every other day. Recheck levels in 2 wks.    Human immunodeficiency virus (HIV) disease   Relevant Medications      azithromycin (ZITHROMAX) tablet   Acute bronchitis -  Primary    Anticipate acute bronchitis leading to mild asthma exacerbation. Treat aggressively, given hx asthma and HIV, with zpack. Push fluids and rest. Update if not improved as expected. Flu swab today - negative        Follow up plan: Return if symptoms worsen or fail to improve.

## 2014-01-31 NOTE — Assessment & Plan Note (Signed)
Titrated coumadin dose accordingly while on zpack - take 1/2 dose every other day. Recheck levels in 2 wks.

## 2014-01-31 NOTE — Patient Instructions (Addendum)
I think you have bronchitis - treat with zpack and schedule albuterol three times daily for next 2 days then as needed. Push fluids and rest. Ok to continue dayquil/nyquil. Let us know if not improving as expected. If not improving let us know for further evaluation. For 5 days while on zpack - take 1/2 tablet of coumadin every other day and return in 2 weeks to coumadin clinic for recheck.

## 2014-02-13 ENCOUNTER — Ambulatory Visit (INDEPENDENT_AMBULATORY_CARE_PROVIDER_SITE_OTHER): Payer: Commercial Managed Care - HMO | Admitting: Family Medicine

## 2014-02-13 DIAGNOSIS — I82409 Acute embolism and thrombosis of unspecified deep veins of unspecified lower extremity: Secondary | ICD-10-CM

## 2014-02-13 LAB — POCT INR: INR: 1.4

## 2014-02-24 ENCOUNTER — Other Ambulatory Visit: Payer: Self-pay | Admitting: Infectious Diseases

## 2014-02-25 ENCOUNTER — Other Ambulatory Visit: Payer: Self-pay | Admitting: Infectious Diseases

## 2014-03-27 ENCOUNTER — Other Ambulatory Visit (INDEPENDENT_AMBULATORY_CARE_PROVIDER_SITE_OTHER): Payer: Commercial Managed Care - HMO

## 2014-03-27 DIAGNOSIS — Z7902 Long term (current) use of antithrombotics/antiplatelets: Secondary | ICD-10-CM

## 2014-03-27 DIAGNOSIS — I82409 Acute embolism and thrombosis of unspecified deep veins of unspecified lower extremity: Secondary | ICD-10-CM

## 2014-03-27 DIAGNOSIS — Z7901 Long term (current) use of anticoagulants: Secondary | ICD-10-CM

## 2014-03-27 LAB — POCT INR: INR: 3.8

## 2014-04-11 ENCOUNTER — Other Ambulatory Visit: Payer: Self-pay | Admitting: Family Medicine

## 2014-04-17 ENCOUNTER — Other Ambulatory Visit (INDEPENDENT_AMBULATORY_CARE_PROVIDER_SITE_OTHER): Payer: Commercial Managed Care - HMO

## 2014-04-17 DIAGNOSIS — I82409 Acute embolism and thrombosis of unspecified deep veins of unspecified lower extremity: Secondary | ICD-10-CM

## 2014-04-17 DIAGNOSIS — Z7902 Long term (current) use of antithrombotics/antiplatelets: Secondary | ICD-10-CM

## 2014-04-17 DIAGNOSIS — Z7901 Long term (current) use of anticoagulants: Secondary | ICD-10-CM

## 2014-04-17 LAB — POCT INR: INR: 2.5

## 2014-05-05 ENCOUNTER — Other Ambulatory Visit: Payer: Self-pay | Admitting: *Deleted

## 2014-05-05 DIAGNOSIS — B20 Human immunodeficiency virus [HIV] disease: Secondary | ICD-10-CM

## 2014-05-05 MED ORDER — EMTRICITABINE-TENOFOVIR DF 200-300 MG PO TABS: 1.0000 | ORAL_TABLET | Freq: Every day | ORAL | Status: AC

## 2014-05-05 MED ORDER — DOLUTEGRAVIR SODIUM 50 MG PO TABS: 50.0000 mg | ORAL_TABLET | Freq: Every day | ORAL | Status: AC

## 2014-05-31 ENCOUNTER — Other Ambulatory Visit: Payer: Self-pay | Admitting: Infectious Diseases

## 2014-06-03 ENCOUNTER — Other Ambulatory Visit: Payer: Self-pay | Admitting: Infectious Diseases

## 2014-06-11 NOTE — Consult Note (Signed)
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 Cardiology was consulted for bradycardia and pauses.  This morning she woke up with intense thirst and feeling very dizzy. She went to use the restroom and upon arising, she fell  back and felt very nauseous and dizzy at which point her husband decided to bring her to the ER for further workup and evaluation.No prior epsiodes. No recent dizziness, illnesses.    In the ER she was noted to have unsteady gait, intense dizziness. She did complain of nausea.Since this AM, she has had no sx. She has had bradycardia overnight and this AM with no sx.  Frequent pauses up to 2.5 seconds with no sx. Her heart rate increases to 75 with ambulation.     The patient stated that she has had a similar episode of dizziness about nine years ago when she had a dental procedure. She had dental work done two days ago. POtassium noted to be 3.0 on arrival.   She denies any new drugs, changes in medications or any recent illness, sick contacts or eating any exotic foods. She does have a history of hypertension for which she is compliant with her medications.    Present Illness .  PAST SURGICAL HISTORY:    1. Gastric bypass surgery. 2. Tonsillectomy. 3. Cholecystectomy. 4. Hernia repair. 5. Tummy tuck.   ALLERGIES: Penicillin.  She had a penicillin allergy as a child and when she was an infant in which she had swollen joints. Sulfa with which she gets muscle cramps and chest pain.   FAMILY HISTORY:   Mother???s side of the family has a significant history of cancer including breast and lung. Mother died of lung cancer. She has an uncle with bone cancer.   SOCIAL HISTORY: She is a nonsmoker, nondrinker. She is employed in the [employer]. Denies any drug use.   Physical  Exam:   GEN well developed, well nourished, no acute distress    HEENT red conjunctivae    NECK supple  No masses    RESP normal resp effort  clear BS    CARD Regular rate and rhythm  No murmur    ABD denies tenderness  soft    LYMPH negative neck    EXTR negative edema    SKIN normal to palpation    NEURO motor/sensory function intact    PSYCH alert, A+O to time, place, person, good insight   Review of Systems:   Subjective/Chief Complaint dizziness, resolved.    General: No Complaints    Skin: No Complaints    ENT: No Complaints    Eyes: No Complaints    Neck: No Complaints    Respiratory: No Complaints    Cardiovascular: No Complaints    Gastrointestinal: No Complaints    Genitourinary: No Complaints    Vascular: No Complaints    Musculoskeletal: No Complaints    Neurologic: Dizzness  resolved    Hematologic: No Complaints    Endocrine: No Complaints    Psychiatric: No Complaints    Review of Systems: All other systems were reviewed and found to be negative    Medications/Allergies Reviewed Medications/Allergies reviewed     DVT - Deep Vein Thrombosis:    HIV positive:    HTN:    Cholecystectomy:    Tonsillectomy:    Hernia Repair:    gastric bypass:  Admit Diagnosis:   BRFADYCARDIA: 26-Jul-2011, Active, BRFADYCARDIA  Home Medications: Medication Instructions Status  albuterol CFC free 90 mcg/inh inhalation aerosol 2 puff(s) inhaled 4 times a day, As Needed- for Wheezing  Active  cyanocobalamin 1000 mcg/mL injectable solution  injectable  Active  Prezista 800 mg oral tablet 1 tab(s) orally once a day Active  Voltaren Topical 1% topical gel Apply topically to affected area 4 times a day Active  ferrous sulfate 325 mg (65 mg elemental iron) oral delayed release tablet 1 tab(s) orally once a day Active  hydrochlorothiazide 50 mg oral tablet 1 tab(s) orally once a day Active  Norvir 100 mg oral tablet 1 tab(s) orally once a  day Active  warfarin 5 mg oral tablet 1 tab(s) orally once a day Active  emtricitabine/rilpivirine/tenofovir 200 mg-25 mg-300 mg oral tablet 1 tab(s) orally once a day Active   Lab Results:  Hepatic:  08-Jun-13 04:46    Bilirubin, Total 0.5   Alkaline Phosphatase 112   SGPT (ALT) 21 (12-78 NOTE: NEW REFERENCE RANGE 01/10/2011)   SGOT (AST) 29   Total Protein, Serum 7.5   Albumin, Serum 3.4  Routine Chem:  08-Jun-13 04:46    Magnesium, Serum 1.8 (1.8-2.4 THERAPEUTIC RANGE: 4-7 mg/dL TOXIC: > 10 mg/dL  -----------------------)   Phosphorus, Serum 3.6 (Result(s) reported on 26 Jul 2011 at 05:35AM.)   Glucose, Serum 94   BUN 13   Creatinine (comp) 0.83   Sodium, Serum 137   Potassium, Serum  3.0   Chloride, Serum 101   CO2, Serum 28   Calcium (Total), Serum 8.6   Osmolality (calc) 274   eGFR (African American) >60   eGFR (Non-African American) >60 (eGFR values <53m/min/1.73 m2 may be an indication of chronic kidney disease (CKD). Calculated eGFR is useful in patients with stable renal function. The eGFR calculation will not be reliable in acutely ill patients when serum creatinine is changing rapidly. It is not useful in  patients on dialysis. The eGFR calculation may not be applicable to patients at the low and high extremes of body sizes, pregnant women, and vegetarians.)   Anion Gap 8  Cardiac:  08-Jun-13 04:46    CK, Total 92   CPK-MB, Serum  < 0.5 (Result(s) reported on 26 Jul 2011 at 05:22AM.)   Troponin I < 0.02 (0.00-0.05 0.05 ng/mL or less: NEGATIVE  Repeat testing in 3-6 hrs  if clinically indicated. >0.05 ng/mL: POTENTIAL  MYOCARDIAL INJURY. Repeat  testing in 3-6 hrs if  clinically indicated. NOTE: An increase or decrease  of 30% or more on serial  testing suggests a  clinically important change)  Routine Coag:  08-Jun-13 04:46    Prothrombin  22.6   INR 2.0 (INR reference interval applies to patients on anticoagulant therapy. A single INR  therapeutic range for coumarins is not optimal for all indications; however, the suggested range for most indications is 2.0 - 3.0. Exceptions to the INR Reference Range may include: Prosthetic heart valves, acute myocardial infarction, prevention of myocardial infarction, and combinations of aspirin and anticoagulant. The need for a higher or lower target INR must be assessed individually. Reference: The Pharmacology and Management of the Vitamin K  antagonists: the seventh ACCP Conference on Antithrombotic and Thrombolytic Therapy. CLKGMW.1027Sept:126 (3suppl): [id number] A HCT value >55% may artifactually increase the PT.  In one study,  the increase was an average of 25%. Reference:  "Effect on Routine and Special Coagulation Testing Values of Citrate Anticoagulant Adjustment in Patients with High  HCT Values." American Journal of Clinical Pathology 2595;638:756-433.)   Activated PTT (APTT) 33.0 (A HCT value >55% may artifactually increase the APTT. In one study, the increase was an average of 19%. Reference: "Effect on Routine and Special Coagulation Testing Values of Citrate Anticoagulant Adjustment in Patients with High HCT Values." American Journal of Clinical Pathology 2006;126:400-405.)  Routine Hem:  08-Jun-13 04:46    WBC (CBC)  3.5   RBC (CBC) 4.67   Hemoglobin (CBC) 14.5   Hematocrit (CBC) 43.1   Platelet Count (CBC) 156 (Result(s) reported on 26 Jul 2011 at 05:06AM.)   MCV 92   MCH 31.0   MCHC 33.6   RDW 13.3   EKG:   Interpretation EKG shows sinus bradycardia, rate 46 bpm    penicillins: Swelling  Sulfa drugs: Other  Vital Signs/Nurse's Notes: **Vital Signs.:   08-Jun-13 12:55   Vital Signs Type Routine   Temperature Temperature (F) 97.9   Celsius 36.6   Temperature Source oral   Pulse Pulse 47   Pulse source per vital sign device   Respirations Respirations 20   Systolic BP Systolic BP 99   Diastolic BP (mmHg) Diastolic BP (mmHg) 65   Mean BP 76    BP Source vital sign device   Pulse Ox % Pulse Ox % 96   Pulse Ox Activity Level  At rest   Oxygen Delivery Room Air/ 21 %     Impression 69 year old Caucasian female with past medical history of human immunodeficiency virus, hypertension, 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 Cardiology was consulted for bradycardia and pauses.  1) Bradycardia rates at rest in the 40s to 4s. no sx overnight and this AM No sx with pauses, up to 2.5 seconds Etiology uncertain, possibly secondary to hypokalemia. Will monitor with two day event monitor with outpt BMP to check potassium in several days Follow up in our office in 1 to 2 weeks She has our office number for additional epsiodes of dizziness Would continue current medications.  2) HIV Follow by Dr. Annie Paras On thriple regimen  3)HTN She only take HCTZ on occasional epsiode BP stable  4)DVT (Leiden factor V) on warfarin   Electronic Signatures: Ida Rogue (MD)  (Signed 08-Jun-13 14:15)  Authored: General Aspect/Present Illness, History and Physical Exam, Review of System, Past Medical History, Health Issues, Home Medications, Labs, EKG , Allergies, Vital Signs/Nurse's Notes, Impression/Plan   Last Updated: 08-Jun-13 14:15 by Ida Rogue (MD)

## 2014-06-11 NOTE — H&P (Signed)
PATIENT NAMEAYLEN, [name] MR#:  [id number] DATE OF BIRTH:  04-29-1945  DATE OF ADMISSION:  07/26/2011  ER REFERRING PHYSICIAN: Dr. Jens Som.  PRIMARY CARE PHYSICIAN:  Dr. Sharen Hones, [place]. ID PHYSICIAN: Dr. Ninetta Lights.   CHIEF COMPLAINT: Dizziness.   INDICATION FOR ADMISSION: Symptomatic bradycardia.   HISTORY OF PRESENT ILLNESS:  This is a 69 year old Caucasian female with past medical history if human immunodeficiency virus, hypertension, right lower extremity deep venous thrombosis, intermittent asthma that presented with the above chief complaint. History is per the patient and her husband who is at the bedside. The patient states that two days ago she had another procedure done with fillings that she tolerated very well. However, this morning she woke up with intense thirst and feeling very dizzy. She went to use the restroom and upon arising, she fell right back and felt very nauseous and dizzy at which point her husband decided to bring her to the ER for further workup and evaluation. In the ER she was noted to have unsteady gait, intense dizziness. She did complain of nausea and on the telemonitor she was noted to have a heart rate in the 40s. EKG also confirmed heart rate in the 40s with a PR interval that was approximately 200 milliseconds. Due to her bradycardia and her dizziness, she is being admitted for further workup and evaluation by the hospitalist service. The patient stated that she has had a similar episode of dizziness about nine years ago when she had another dental procedure done at that time however resolved spontaneously. She denies any new drugs, changes in medications or any recent illness, sick contacts or eating any exotic foods. She does have a history of hypertension for which she is compliant with her medications for and she does have a history of human immunodeficiency virus for which she is compliant with her medications for also.   PAST MEDICAL HISTORY:    1. Human immunodeficiency virus. 2. Hypertension. 3. History of right lower extremity deep venous thrombosis on Coumadin.  4. Hereditary blood-clotting disorder.  5. Intermittent asthma.   HOME MEDICATIONS:  1. Albuterol two puffs four times a day as needed for wheezing.  2. Cyanocobalamin 100 micrograms injectable solution monthly.  3. Complivir 200/25/300 one tablet daily.  4. Ferrous Sulfate 325 one tablet daily. 5. Hydrochlorothiazide 50 milligrams one tablet daily.  6. Norvir 100 milligrams one tab daily.  7. Prezista 800 milligrams one tab daily. 8. Voltaren topical 1% topical gel apply to affected area four times daily.  9. Warfarin 5 milligrams one tab daily.   PAST SURGICAL HISTORY:    1. Gastric bypass surgery. 2. Tonsillectomy. 3. Cholecystectomy. 4. Hernia repair. 5. Tummy tuck.   ALLERGIES: Penicillin.  She had a penicillin allergy as a child and when she was an infant in which she had swollen joints. Sulfa with which she gets muscle cramps and chest pain.   FAMILY HISTORY:   Mother's side of the family has a significant history of cancer including breast and lung. Mother died of lung cancer. She has an uncle with bone cancer.   SOCIAL HISTORY: She is a nonsmoker, nondrinker. She is employed in the [employer]. Denies any drug use.   REVIEW OF SYSTEMS:  No fever, fatigue, weakness. Positive dizziness. No weight loss or weight gain. EYES: No blurred vision, double vision, pain or redness. ENT: No tinnitus, ear pain, hearing loss, seasonal allergies, epistaxis, discharge. RESPIRATORY: No cough, wheezes, hemoptysis, dyspnea. Positive asthma. CARDIOVASCULAR: Mild chest pain.  Otherwise, no orthopnea, edema, arrhythmia, dyspnea on exertion. GASTROINTESTINAL: Positive nausea. No vomiting, diarrhea, abdominal pain, hematemesis.  GENITOURINARY: No dysuria, hematuria. ENDOCRINE: No polyuria, nocturia, or thyroid problems. HEME/LYMPH: No anemia, easy bruising, bleeding or  swollen glands. INTEGUMENT: No acne, rash, lesions, or change in mole, hair or skin. MUSCULOSKELETAL: Positive arthritis. Otherwise, no unusual neck, back, shoulder, knee or hip pain. NEUROLOGIC: No numbness. Positive vertigo and ataxia. No dementia, headache, migraines, cerebrovascular accident or transient ischemic attack. PSYCH: No anxiety, insomnia, ADD, OCD, bipolar or depression.   PHYSICAL EXAMINATION:   VITAL SIGNS: Temperature 97.6, pulse 47, respirations 16, blood pressure 130/68, pulse ox 98% on room air.  GENERAL: Well-developed, well-nourished mildly obese female lying in bed in no acute respiratory distress.   HEENT: Pupils are equal, round, reactive to light and accommodation.  Extraocular movements intact. No scleral icterus or conjunctivitis. No difficulty hearing. Tympanic membranes intact. No pharyngeal erythema. Mucous membranes are moist. Dentition is good.   NECK: No thyroid enlargement, nodules or tenderness. Neck is supple and nontender. No adenopathy. No jugular venous distention. No carotid bruits. Full range of motion.   LUNGS: Clear to auscultation bilaterally. No rales, rhonchi, crackles, or diminished breath sounds. No wheezing. No labored breathing. No increased effort.   CARDIOVASCULAR: Regular rate and rhythm. S1, S2 auscultated. No PMI lateralization. Good pedal and femoral pulses. No lower extremity edema.   ABDOMEN: Soft, nontender, nondistended. Positive bowel sounds.   MUSCULOSKELETAL: 5/5 strength bilateral upper and lower extremities. No cyanosis or degenerative joint disease. Upon my examination, she did have mildly unsteady gait. This was a moderate improvement from the ED physician examination.   SKIN: No rash, lesion or erythema. No nodules or induration. Skin is warm and dry.   LYMPH: No adenopathy in the cervical, axilla or supraclavicular region.   NEUROLOGIC: Cranial nerves 2-12 intact. Deep tendon reflexes intact. Sensory is intact.   PSYCH:  Alert and oriented to place and time. Cooperative. Good judgment.   LABORATORY, RADIOLOGICAL AND DIAGNOSTIC DATA: Sodium 137, potassium 3.0, chloride 101, bicarb 28, BUN 13, creatinine 0.83, glucose 94, CK 92, CK MB less than 0.5, troponin less than 0.02. White cell count 3.5, hemoglobin 14.5, hematocrit 43.1, platelet count 156. Prothrombin time 22.6, INR 2.0. PT 33.0. Urinalysis shows 1+ leukocyte esterase, 2 red blood cells, 35 white blood cells. No bacteria. She is nitrite positive. Specific gravity 1.008. EKG shows sinus bradycardia. PR interval is approximately 200 milliseconds. Otherwise, no acute ST-T wave changes. She did not have a chest x-ray done at this time.   ASSESSMENT AND PLAN:  69 year old female with past medical history of hypertension, human immunodeficiency virus, right deep venous thrombosis, hereditary clotting disorder who presented with dizziness.  1. Dizziness. Differential includes medication induced symptomatic bradycardia. Will admit her to the telemetry bed, check orthostatics. Check a CT of the head. The patient does have bradycardia with heart rate in the low 40s with mild nausea and dizziness. She had a similar episode about nine years ago with a dental procedure and then again with another dental procedure done about two days ago, possibly could be dizziness and symptomatic bradycardia could be due to the anesthetic effect. This is questionable however. 2. Symptomatic bradycardia associated with nausea and dizziness. Heart rate around 50 and on the monitor dropping to as low as 35-36 with intensive nausea and dizziness. EKG showed a PR interval that was at the upper limits of normal at 200 milliseconds on the KG. This could be associated from  Norvir after review of the medication list and researching the Norvir on the [facility]. Does have a 2-5% incidence of PR prolongation. Will consult cardiology in the morning for further input and evaluation. 3. Hypertension. Currently on  Hydrochlorothiazide. Will hold for now given the level of dizziness and will check orthostatics also.  4. Human immunodeficiency virus. Per the patient, she is on Norvir, Prezista and Complexa for which she is compliant with. She says she has normal CD4 levels and CD8 counts. At this time will hold her meds for a day given that maybe the Norvir could be causing some PR prolongation. May resume once bradycardia completely resolves after about 12-24 hours of monitoring.  5. Right lower extremity deep venous thrombosis. The patient is currently on Coumadin. Her INR is at 2.0. Will continue monitoring her INR. She is on Coumadin for hereditary clotting disorder and right lower extremity deep venous thrombosis.  6. History of intermittent asthma. Continue with PR Albuterol.  7. Possible urinary tract infection. Will check a urine culture. Currently with no fevers and no elevations of white cell count. Will continue to monitor.  8. The patient is a FULL CODE.   [facility].    TIME SPENT DICTATING AND EVALUATING THE PATIENT: 50 minutes.    ____________________________ Stephanie AcreVishal Rea Kalama, MD vm:ap D: 07/26/2011 06:55:59 ET T: 07/26/2011 10:36:58 ET JOB#: [id number]  cc: Stephanie AcreVishal Daine Gunther, MD, <Dictator> Eustaquio BoydenJavier Gutierrez, MD Stephanie AcreVISHAL Christabella Alvira MD ELECTRONICALLY SIGNED 07/26/2011 22:25

## 2014-06-11 NOTE — Discharge Summary (Signed)
PATIENT NAMDebbora Brewer:  [name], [name] MR#:  [id number] DATE OF BIRTH:  07/17/1945  DATE OF ADMISSION:  07/26/2011 DATE OF DISCHARGE:  07/26/2011  ADMITTING DIAGNOSES:  1. Dizziness.  2. Bradycardia.   DISCHARGE DIAGNOSES:  1. Dizziness, likely due to bradycardia, now resolved.  2. Symptomatic bradycardia in the setting of hypokalemia, status post evaluation by Dr. Mariah MillingGollan. Patient will have a 24-hour monitor. She is currently asymptomatic, ambulated with elevation in the heart rate.  3. Hypokalemia likely due to HCTZ therapy, now being replaced.  4. Hypertension. HCTZ is currently on hold until seen by primary physician.  5. Human immunodeficiency virus.  6. History of lower extremity deep vein thrombosis currently on Coumadin. INR is therapeutic.  7. History of intermittent asthma.  8. Status post gastric bypass surgery.  9. Status post tonsillectomy.  10. Status post cholecystectomy.  11. Status post hernia repair.  12. Status post tummy tuck.   LABORATORY, DIAGNOSTIC AND RADIOLOGICAL DATA: Sodium 137, potassium 3.0, chloride 101, bicarbonate 28, BUN 13, creatinine 0.83, glucose 94, CPK 92. CK-MB less than 0.5. Troponin less than 0.02. WBC count 3.5, hemoglobin 14.5, hematocrit 43.1, platelet count 156, INR 2.0. Urinalysis showed 1+ leukocytes, 2+ blood, 35 WBCs, no bacteria. She was nitrite positive, specific gravity 1.008. EKG showed sinus bradycardia with PR interval of approximately 200 ms otherwise no ST-T wave changes. Troponin less than 0.02 x3. Urine culture showed mixed bacterial organisms. CT of the head showed no acute abnormality. Chest x-ray no acute abnormality.   CONSULTANT: Dr. Mariah MillingGollan.   HOSPITAL COURSE: Please refer to history and physical done by the admitting physician. Patient is a 69 year old white female with past medical history of HIV, hypertension, right lower extremity deep vein thrombosis, intermittent asthma presented with complaint of feeling dizziness and was noted to  have low heart rate in the 40s and was noted to have hypokalemia. Patient was admitted and kept on telemetry. She was seen in consultation by cardiology. Her heart rate stayed in the 45 to 50 range. She initially was a little hypotensive on presentation, with IV fluids her blood pressure normalized without any orthostatic changes. Once she was ambulated her heart rate normalized and she had a good inotropic response. She was seen by Dr. Mariah MillingGollan. Patient was very anxious to go home and he felt that she was stable for discharge. He has arranged a 24 hour monitor for her. At this time she is doing much better, stable for discharge.   DISCHARGE MEDICATIONS:  1. Albuterol 2 puffs 4 times per day. 2. Cyanocobalamin as taking previously. 3. Prezista 800, 1 tablet p.o. daily. 4. Voltaren topical gel as needed. 5. Iron sulfate 325 mg 1 tab p.o. daily.  6. Norvir 100 mg daily.  7. Warfarin 5 mg daily.  8. Emtricitabine/rilpivirine/tenofovir 1 tablet p.o. daily.    NOTE: Patient is told not to take HCTZ.   DIET: Low sodium.   ACTIVITY: As tolerated.   TIMEFRAME FOR FOLLOW UP: [facility] Follow up with Dr. Mariah MillingGollan in 1 to 2 weeks. BMP check at time of visit to primary M.D. Patient also is to get a monitor which will be arranged by Dr. Mariah MillingGollan.  TIME SPENT: 32 minutes.   ____________________________ Lacie ScottsShreyang H. Allena KatzPatel, MD shp:cms D: 07/27/2011 13:30:50 ET T: 07/29/2011 10:12:36 ET  JOB#: [id number] cc: Loxley Cibrian H. Allena KatzPatel, MD, <Dictator> Charise CarwinSHREYANG H Juandaniel Manfredo MD ELECTRONICALLY SIGNED 08/06/2011 11:39

## 2014-06-14 ENCOUNTER — Other Ambulatory Visit: Payer: Commercial Managed Care - HMO

## 2014-06-28 ENCOUNTER — Ambulatory Visit: Payer: Commercial Managed Care - HMO | Admitting: Infectious Diseases

## 2014-08-24 ENCOUNTER — Telehealth: Payer: Self-pay | Admitting: *Deleted

## 2014-08-24 NOTE — Telephone Encounter (Signed)
Received ROI from [facility].  No return information included on the ROI.  Pt needs to contact [facility] for fax number.

## 2014-08-25 NOTE — Telephone Encounter (Signed)
Received fax, phone and address for [facility].  Will fax immediate information and then, place ROI in Copy Service box to process the entire record.

## 2014-09-07 ENCOUNTER — Other Ambulatory Visit: Payer: Self-pay | Admitting: Family Medicine

## 2014-10-12 ENCOUNTER — Other Ambulatory Visit: Payer: Self-pay | Admitting: Family Medicine

## 2017-02-19 ENCOUNTER — Ambulatory Visit: Payer: Self-pay | Admitting: General Practice
